# Patient Record
Sex: Female | Born: 1970 | Hispanic: No | Marital: Married | Smoking: Never smoker
Health system: Southern US, Community
[De-identification: ages and names within clinical notes are randomized; demographics above are authoritative.]

## PROBLEM LIST (undated history)

## (undated) DIAGNOSIS — M810 Age-related osteoporosis without current pathological fracture: Secondary | ICD-10-CM

## (undated) DIAGNOSIS — E288 Other ovarian dysfunction: Secondary | ICD-10-CM

## (undated) DIAGNOSIS — G4700 Insomnia, unspecified: Secondary | ICD-10-CM

## (undated) DIAGNOSIS — M8580 Other specified disorders of bone density and structure, unspecified site: Secondary | ICD-10-CM

## (undated) DIAGNOSIS — E7800 Pure hypercholesterolemia, unspecified: Secondary | ICD-10-CM

## (undated) DIAGNOSIS — E2839 Other primary ovarian failure: Secondary | ICD-10-CM

## (undated) DIAGNOSIS — T7840XA Allergy, unspecified, initial encounter: Secondary | ICD-10-CM

## (undated) DIAGNOSIS — E221 Hyperprolactinemia: Secondary | ICD-10-CM

## (undated) HISTORY — DX: Other ovarian dysfunction: E28.8

## (undated) HISTORY — PX: TUBAL LIGATION: SHX77

## (undated) HISTORY — DX: Age-related osteoporosis without current pathological fracture: M81.0

## (undated) HISTORY — DX: Other primary ovarian failure: E28.39

## (undated) HISTORY — DX: Pure hypercholesterolemia, unspecified: E78.00

## (undated) HISTORY — DX: Allergy, unspecified, initial encounter: T78.40XA

## (undated) HISTORY — DX: Insomnia, unspecified: G47.00

## (undated) HISTORY — DX: Hyperprolactinemia: E22.1

## (undated) HISTORY — PX: CHOLECYSTECTOMY: SHX55

## (undated) HISTORY — DX: Other specified disorders of bone density and structure, unspecified site: M85.80

---

## 2003-03-05 ENCOUNTER — Ambulatory Visit (HOSPITAL_COMMUNITY): Admission: RE | Admit: 2003-03-05 | Discharge: 2003-03-05 | Payer: Self-pay | Admitting: *Deleted

## 2003-03-05 ENCOUNTER — Encounter: Payer: Self-pay | Admitting: *Deleted

## 2003-05-15 ENCOUNTER — Ambulatory Visit (HOSPITAL_COMMUNITY): Admission: RE | Admit: 2003-05-15 | Discharge: 2003-05-15 | Payer: Self-pay | Admitting: *Deleted

## 2003-05-22 ENCOUNTER — Encounter: Admission: RE | Admit: 2003-05-22 | Discharge: 2003-08-20 | Payer: Self-pay | Admitting: *Deleted

## 2003-05-30 ENCOUNTER — Encounter: Admission: RE | Admit: 2003-05-30 | Discharge: 2003-05-30 | Payer: Self-pay | Admitting: *Deleted

## 2003-06-06 ENCOUNTER — Encounter: Admission: RE | Admit: 2003-06-06 | Discharge: 2003-06-06 | Payer: Self-pay | Admitting: *Deleted

## 2003-06-13 ENCOUNTER — Encounter: Admission: RE | Admit: 2003-06-13 | Discharge: 2003-06-13 | Payer: Self-pay | Admitting: *Deleted

## 2003-06-20 ENCOUNTER — Encounter: Admission: RE | Admit: 2003-06-20 | Discharge: 2003-06-20 | Payer: Self-pay | Admitting: *Deleted

## 2003-06-27 ENCOUNTER — Ambulatory Visit (HOSPITAL_COMMUNITY): Admission: RE | Admit: 2003-06-27 | Discharge: 2003-06-27 | Payer: Self-pay | Admitting: *Deleted

## 2003-06-27 ENCOUNTER — Encounter: Admission: RE | Admit: 2003-06-27 | Discharge: 2003-06-27 | Payer: Self-pay | Admitting: *Deleted

## 2003-06-29 ENCOUNTER — Encounter: Admission: RE | Admit: 2003-06-29 | Discharge: 2003-06-29 | Payer: Self-pay | Admitting: Family Medicine

## 2003-07-04 ENCOUNTER — Encounter: Admission: RE | Admit: 2003-07-04 | Discharge: 2003-07-04 | Payer: Self-pay | Admitting: *Deleted

## 2003-07-06 ENCOUNTER — Encounter: Admission: RE | Admit: 2003-07-06 | Discharge: 2003-07-06 | Payer: Self-pay | Admitting: *Deleted

## 2003-07-11 ENCOUNTER — Encounter: Admission: RE | Admit: 2003-07-11 | Discharge: 2003-07-11 | Payer: Self-pay | Admitting: *Deleted

## 2003-07-13 ENCOUNTER — Encounter: Admission: RE | Admit: 2003-07-13 | Discharge: 2003-07-13 | Payer: Self-pay | Admitting: *Deleted

## 2003-07-18 ENCOUNTER — Encounter: Admission: RE | Admit: 2003-07-18 | Discharge: 2003-07-18 | Payer: Self-pay | Admitting: *Deleted

## 2003-07-20 ENCOUNTER — Encounter: Admission: RE | Admit: 2003-07-20 | Discharge: 2003-07-20 | Payer: Self-pay | Admitting: *Deleted

## 2003-07-23 ENCOUNTER — Encounter: Payer: Self-pay | Admitting: *Deleted

## 2003-07-23 ENCOUNTER — Inpatient Hospital Stay (HOSPITAL_COMMUNITY): Admission: RE | Admit: 2003-07-23 | Discharge: 2003-07-23 | Payer: Self-pay | Admitting: *Deleted

## 2003-07-23 ENCOUNTER — Inpatient Hospital Stay (HOSPITAL_COMMUNITY): Admission: AD | Admit: 2003-07-23 | Discharge: 2003-07-27 | Payer: Self-pay | Admitting: Obstetrics and Gynecology

## 2003-07-25 ENCOUNTER — Encounter (INDEPENDENT_AMBULATORY_CARE_PROVIDER_SITE_OTHER): Payer: Self-pay

## 2004-07-01 ENCOUNTER — Emergency Department (HOSPITAL_COMMUNITY): Admission: EM | Admit: 2004-07-01 | Discharge: 2004-07-01 | Payer: Self-pay | Admitting: Emergency Medicine

## 2007-03-21 ENCOUNTER — Emergency Department (HOSPITAL_COMMUNITY): Admission: EM | Admit: 2007-03-21 | Discharge: 2007-03-21 | Payer: Self-pay | Admitting: Emergency Medicine

## 2008-01-20 ENCOUNTER — Other Ambulatory Visit: Admission: RE | Admit: 2008-01-20 | Discharge: 2008-01-20 | Payer: Self-pay | Admitting: Gynecology

## 2008-02-06 ENCOUNTER — Encounter: Admission: RE | Admit: 2008-02-06 | Discharge: 2008-02-06 | Payer: Self-pay | Admitting: Gynecology

## 2009-02-05 ENCOUNTER — Ambulatory Visit: Payer: Self-pay | Admitting: Gynecology

## 2009-02-05 ENCOUNTER — Encounter: Payer: Self-pay | Admitting: Gynecology

## 2009-02-05 ENCOUNTER — Other Ambulatory Visit: Admission: RE | Admit: 2009-02-05 | Discharge: 2009-02-05 | Payer: Self-pay | Admitting: Gynecology

## 2009-02-11 ENCOUNTER — Ambulatory Visit: Payer: Self-pay | Admitting: Gynecology

## 2009-05-20 ENCOUNTER — Ambulatory Visit: Payer: Self-pay | Admitting: Gynecology

## 2010-04-17 ENCOUNTER — Other Ambulatory Visit: Admission: RE | Admit: 2010-04-17 | Discharge: 2010-04-17 | Payer: Self-pay | Admitting: Gynecology

## 2010-04-17 ENCOUNTER — Ambulatory Visit: Payer: Self-pay | Admitting: Gynecology

## 2010-07-22 ENCOUNTER — Ambulatory Visit: Payer: Self-pay | Admitting: Gynecology

## 2010-11-18 ENCOUNTER — Encounter
Admission: RE | Admit: 2010-11-18 | Discharge: 2010-11-18 | Payer: Self-pay | Source: Home / Self Care | Attending: Gynecology | Admitting: Gynecology

## 2011-04-10 NOTE — Op Note (Signed)
Karen Peterson, Karen Peterson                          ACCOUNT NO.:  [id number]   MEDICAL RECORD NO.:  [id number]                   PATIENT TYPE:  INP   LOCATION:  9322                                 FACILITY:  [facility]   PHYSICIAN:  Phil D. Okey Dupre, M.D.                  DATE OF BIRTH:  02-14-1971   DATE OF PROCEDURE:  07/25/2003  DATE OF DISCHARGE:                                 OPERATIVE REPORT   PROCEDURE:  Modified Pomeroy bilateral tubal ligation.   PREOPERATIVE DIAGNOSIS:  Multiparity,voluntary sterilization.   POSTOPERATIVE DIAGNOSIS:  Multiparity,voluntary sterilization.   ANESTHESIA:  Epidural.   SURGEON:  Phil D. Rose, M.D.   ESTIMATED BLOOD LOSS:  Minimal, less than 5 mL.   PROCEDURE IN DETAIL:  Under satisfactory epidural anesthesia, with the  patient in the dorsal supine position, the abdomen was prepped and draped in  the usual sterile manner and entered through a subumbilical transverse  incision situated 3 cm below the umbilicus and extended for a total length  of 5 cm.  The abdomen was entered by layers and on entering the peritoneal  cavity, the right fallopian tube was grasped in the mid portion, followed  down to the fimbria.  Then, a hemostat was placed through the mesosalpinx  beneath the tube in an avascular area and a #1 plain suture catgut brought  through this, tied around the distal and proximal end of the tube, forming a  loop of approximately 2 cm above the tie.  The second tie was placed just  below the first tie.  The section of tube above the ties was excised and  sent for pathological diagnosis.  The exposed ends of the tubes from the  dissection were coagulated with hot cautery.  The area was observed for  bleeding, none was noted.  The same procedure was carried out with regards  to the left fallopian tube.  The peritoneum was then closed with a  continuous running 0 Vicryl on an atraumatic needle which was tented up to  close the fascia with a running  suture.  This was tied and cut short.  Subcuticular sutures of 3-0 Monocryl was used for subcuticular closure.  A  dry, sterile dressing was applied.  Minimal blood loss during the procedure  was less than 5 mL.  The patient tolerated the procedure well.  Tape,  instrument, sponge, and needle counts were correct at the end of the  procedure.  The patient was transferred to the recovery room in satisfactory  condition.  The portion of fallopian tube was removed and sent for  pathological diagnosis.                                               Phil D. Okey Dupre, M.D.  PDR/MEDQ  D:  07/25/2003  T:  07/25/2003  Job:  [id number]

## 2011-04-10 NOTE — Discharge Summary (Signed)
   NAMEZOEI, AMISON                          ACCOUNT NO.:  [id number]   MEDICAL RECORD NO.:  [id number]                   PATIENT TYPE:  INP   LOCATION:  9322                                 [facility]ACILITY:  [facility]   PHYSICIAN:  Phil D. Okey Dupre, M.D.                  DATE OF BIRTH:  10/25/1971   DATE OF ADMISSION:  07/23/2003  DATE OF DISCHARGE:  07/27/2003                                 DISCHARGE SUMMARY   HISTORY:  This is a 40 year old G4, P3-0-0-3 at the time of admission at 90-  weeks estimated gestational age by last menstrual period who presented to  [facility] for induction of labor, on July 23, 2003.  The patient had  been seen in the MAU by Dr. Gavin Potters and was sent for induction secondary to  questionable macrosomic fetus.  The patient experienced gestational diabetes  and had a history of pregnancy induced hypertension for her last pregnancy.   [facility]  She was  taking insulin intrapartum.  Has no known allergies.  GBS negative.  Rubella  immune.  Rh negative.   The patient admitted for her cervical ripening.  Given Cervidil and, on  July 24, 2003 at approximately 0630, delivered by normal spontaneous  vaginal delivery.  The patient had subsequent bilateral tubal ligation.  Please see op-note for details of this procedure.  The patient had an  unremarkable postpartum and postoperative course.  Blood sugars returned to  normal.  Hemoglobin, prior to discharge, was 10.9.   Patient given instructions to visit [facility]U if she has an increase in abdominal  pain, tenderness or has redness or discharge from her op site.   DISCHARGE MEDICATIONS:  1. She was given ibuprofen 600 mg, one by mouth every six hours as needed     for pain.  2. Percocet 5/325, one tab every six hours as needed for pain.  3. Prenatal vitamins, one tab every day x 6 weeks.   DIET:  The patient was advised that her diet could be regular.   ACTIVITY:  No restriction.   SEXUAL ACTIVITY:  Nothing in the vagina for six weeks.   FOLLOWUP:  The patient is scheduled to followup, currently, at [facility]  in six weeks, although, may consider followup earlier to reassess diabetic  status.  The patient was given an appointment for her baby at [facility] on August 01, 2003 at 10:30 with Dr. Farris Has.  The patient was  discharged to home in stable condition.     Franklyn Lor, MD                            Javier Glazier. Okey Dupre, M.D.    TD/MEDQ  D:  07/27/2003  T:  07/28/2003  Job:  [id number]

## 2012-01-24 ENCOUNTER — Ambulatory Visit (INDEPENDENT_AMBULATORY_CARE_PROVIDER_SITE_OTHER): Payer: BC Managed Care – PPO | Admitting: Family Medicine

## 2012-01-24 ENCOUNTER — Ambulatory Visit: Payer: BC Managed Care – PPO

## 2012-01-24 VITALS — BP 133/88 | HR 87 | Temp 98.3°F | Resp 16 | Ht 62.5 in | Wt 188.0 lb

## 2012-01-24 DIAGNOSIS — M719 Bursopathy, unspecified: Secondary | ICD-10-CM

## 2012-01-24 DIAGNOSIS — M25519 Pain in unspecified shoulder: Secondary | ICD-10-CM

## 2012-01-24 DIAGNOSIS — M67919 Unspecified disorder of synovium and tendon, unspecified shoulder: Secondary | ICD-10-CM

## 2012-01-24 DIAGNOSIS — IMO0001 Reserved for inherently not codable concepts without codable children: Secondary | ICD-10-CM

## 2012-01-24 DIAGNOSIS — M7580 Other shoulder lesions, unspecified shoulder: Secondary | ICD-10-CM

## 2012-01-24 DIAGNOSIS — M25511 Pain in right shoulder: Secondary | ICD-10-CM

## 2012-01-24 MED ORDER — MELOXICAM 7.5 MG PO TABS: 7.5000 mg | ORAL_TABLET | Freq: Every day | ORAL | Status: AC

## 2012-01-24 NOTE — Patient Instructions (Addendum)
Can take mobic each day as needed for pain and inflammation.  Heat over area and try to work on range of motion, only use sling as needed as the stiffness in your shoulder can worsen with this.  Recheck in next 4-5 days if not improving. Return to the clinic or go to the nearest emergency room if any of your symptoms worsen or new symptoms occur.    Rotator Cuff Tendonitis  The rotator cuff is the collection of all the muscles and tendons (the supraspinatus, infraspinatus, subscapularis, and teres minor muscles and their tendons) that help your shoulder stay in place. This unit holds the head of the upper arm bone (humerus) in the cup (fossa) of the shoulder blade (scapula). Basically, it connects the arm to the shoulder. Tendinitis is a swelling and irritation of the tissue, called cord like structures (tendons) that connect muscle to bone. It usually is caused by overusing the joint involved. When the tissue surrounding a tendon (the synovium) becomes inflamed, it is called tenosynovitis. This also is often the result of overuse in people whose jobs require repetitive (over and over again) types of motion. HOME CARE INSTRUCTIONS   Use a sling or splint for as long as directed by your caregiver until the pain decreases.   Apply ice to the injury for 15 to 20 minutes, 3 to 4 times per day. Put the ice in a plastic bag and place a towel between the bag of ice and your skin.   Try to avoid use other than gentle range of motion while your shoulder is painful. Use and exercise only as directed by your caregiver. Stop exercises or range of motion if pain or discomfort increases, unless directed otherwise by your caregiver.   Only take over-the-counter or prescription medicines for pain, discomfort, or fever as directed by your caregiver.   If you were give a shoulder sling and straps (immobilizer), do not remove it except as directed, or until you see a caregiver for a follow-up examination. If you need  to remove it, move your arm as little as possible or as directed.   You may want to sleep on several pillows at night to lessen swelling and pain.  SEEK IMMEDIATE MEDICAL CARE IF:   Pain in your shoulder increases or new pain develops in your arm, hand, or fingers and is not relieved with medications.   You develop new, unexplained symptoms, especially increased numbness in the hands or loss of strength, or you develop any worsening of the problems which brought you in for care.   Your arm, hand, or fingers are numb or tingling.   Your arm, hand, or fingers are swollen, painful, or turn white or blue.  Document Released: 01/30/2004 Document Revised: 10/29/2011 Document Reviewed: 09/06/2008 [employer] [facility] Patient Information 2012 [place], [place].

## 2012-01-24 NOTE — Progress Notes (Signed)
  Subjective:    Patient ID: Karen Peterson, female    DOB: 09-18-1971, 41 y.o.   MRN: [id number]  HPI Karen Peterson is a 41 y.o. female History of Diabetes and high cholesterol.  Unknown last A1c - followed by Dr. Talmage Nap.   C/o 1 week history of R shoulder pain, NKI.  Sore to move.  Noticed some soreness in upper shoulder to neck today only.  No prior similar symptoms or surgery.   R hand dominant. 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   Tx:  None except occasional tylenol.    Review of Systems  Constitutional: Negative for fever, chills and activity change.  HENT: Negative for neck pain and neck stiffness.   Respiratory: Negative for chest tightness and shortness of breath.   Cardiovascular: Negative for chest pain.  Musculoskeletal: Positive for arthralgias.  Neurological: Negative for weakness and numbness.       Objective:   Physical Exam  Constitutional: She appears well-developed and well-nourished.  HENT:  Head: Normocephalic and atraumatic.  Eyes: Conjunctivae are normal. Pupils are equal, round, and reactive to light.  Neck: Muscular tenderness present. Decreased range of motion present.       Slight R paraspinal spasm/ttp and decreased L rotation due to "tightness", not pain.  Cardiovascular: Normal rate, regular rhythm and normal heart sounds.   No murmur heard. Pulmonary/Chest: Effort normal and breath sounds normal.  Musculoskeletal:       Right shoulder: She exhibits decreased range of motion. She exhibits no tenderness and no bony tenderness.       Right elbow: Normal.      Right wrist: Normal.       R shoulder AROM: flex 100, abd 100, IR equal - L1. PROM : flex~110, abd ~110, but guarded (pulling sensation). [place]/AC nt Guarded RC testing, but appears to have strength with empty can and RTC testing. Positive Neer and Hawkins.  Neurological: She has normal strength. No sensory  deficit.    [facility] reading (PRIMARY) by  Dr. Neva Seat R shoulder: NAD    Assessment & Plan:  Karen Peterson is a 41 y.o. female Hx of DM.  R shoulder pain RTC tendonitis, subacromial bursitis likely with repetitive movements, versus adhesive capsulitis.  Guarded exam at present.  Trial of mobic 7.5mg  qd - short course w/ hx. DM.  Heat, ROM, oow x 3 days, then recheck - may need injection vs. Ortho/pt eval. Sooner if worse.

## 2012-02-23 ENCOUNTER — Other Ambulatory Visit: Payer: Self-pay | Admitting: Gynecology

## 2012-02-23 DIAGNOSIS — Z1231 Encounter for screening mammogram for malignant neoplasm of breast: Secondary | ICD-10-CM

## 2012-02-29 DIAGNOSIS — G4700 Insomnia, unspecified: Secondary | ICD-10-CM | POA: Insufficient documentation

## 2012-02-29 DIAGNOSIS — E221 Hyperprolactinemia: Secondary | ICD-10-CM | POA: Insufficient documentation

## 2012-03-07 ENCOUNTER — Ambulatory Visit
Admission: RE | Admit: 2012-03-07 | Discharge: 2012-03-07 | Disposition: A | Discharge status: Home / Self Care | Payer: BC Managed Care – PPO | Source: Ambulatory Visit | Attending: Gynecology | Admitting: Gynecology

## 2012-03-07 DIAGNOSIS — Z1231 Encounter for screening mammogram for malignant neoplasm of breast: Secondary | ICD-10-CM

## 2012-03-10 ENCOUNTER — Encounter: Payer: Self-pay | Admitting: Gynecology

## 2012-03-16 ENCOUNTER — Encounter: Payer: Self-pay | Admitting: Gynecology

## 2012-03-24 ENCOUNTER — Encounter: Payer: Self-pay | Admitting: Gynecology

## 2012-03-24 ENCOUNTER — Ambulatory Visit (INDEPENDENT_AMBULATORY_CARE_PROVIDER_SITE_OTHER): Payer: BC Managed Care – PPO | Admitting: Gynecology

## 2012-03-24 VITALS — BP 128/84 | Ht 62.0 in | Wt 190.0 lb

## 2012-03-24 DIAGNOSIS — E119 Type 2 diabetes mellitus without complications: Secondary | ICD-10-CM | POA: Insufficient documentation

## 2012-03-24 DIAGNOSIS — E28319 Asymptomatic premature menopause: Secondary | ICD-10-CM | POA: Insufficient documentation

## 2012-03-24 DIAGNOSIS — Z01419 Encounter for gynecological examination (general) (routine) without abnormal findings: Secondary | ICD-10-CM

## 2012-03-24 DIAGNOSIS — J019 Acute sinusitis, unspecified: Secondary | ICD-10-CM

## 2012-03-24 DIAGNOSIS — R635 Abnormal weight gain: Secondary | ICD-10-CM

## 2012-03-24 DIAGNOSIS — E7800 Pure hypercholesterolemia, unspecified: Secondary | ICD-10-CM | POA: Insufficient documentation

## 2012-03-24 MED ORDER — NORETHINDRONE ACET-ETHINYL EST 1.5-30 MG-MCG PO TABS: 1.0000 | ORAL_TABLET | Freq: Every day | ORAL | Status: DC

## 2012-03-24 MED ORDER — CEFUROXIME AXETIL 500 MG PO TABS: 500.0000 mg | ORAL_TABLET | Freq: Two times a day (BID) | ORAL | Status: AC

## 2012-03-24 NOTE — Progress Notes (Signed)
Karen Peterson 09/16/1971 [id number]   History:    41 y.o.  for annual exam  with four-day history of frontal sinus pressure and postnasal drip. Patient denies fever chills nausea or vomiting. Patient has been followed by Dr. Lurene Shadow for her hypercholesterolemia and type 2 diabetes all her labs were done in February this year so no additional blood work will be drawn today. Patient been diagnosed with premature ovarian failure in 2008 and she has been on Junel 1/20 oral contraceptive pill. She is taking her calcium and vitamin D for osteoporosis prevention. She did have a bone density study in 2010 with her lowest T score of -2.0 at the [facility]. Patient several years ago with hyperprolactinemia and her last prolactin level several years ago was normal when off medication. Patient denies any nipple discharge or any unusual headaches or visual disturbance. Last Pap smear 2011 normal.   Past medical history,surgical history, family history and social history were all reviewed and documented in the EPIC chart.  Gynecologic History Patient's last menstrual period was 03/15/2012. Contraception: OCP (estrogen/progesterone) Last Pap:  2011. Results were: normal Last mammogram: 2013. Results were: normal  Obstetric History OB History    Grav Para Term Preterm Abortions TAB SAB Ect Mult Living   4 4 4       4      # Outc Date GA Lbr Len/2nd Wgt Sex Del Anes PTL Lv   1 TRM     F SVD  No Yes   2 TRM     M SVD  No Yes   3 TRM     F SVD  No Yes   4 TRM     F SVD  No Yes       ROS:  Was performed and pertinent positives and negatives are included in the history.  Exam: chaperone present  BP 128/84  Ht 5\' 2"  (1.575 m)  Wt 190 lb (86.183 kg)  BMI 34.75 kg/m2  LMP 03/15/2012  Body mass index is 34.75 kg/(m^2).  General appearance : Well developed well nourished female. No acute distress HEENT: Neck supple, trachea midline, no carotid bruits, no thyroidmegaly, tender frontal and maxillary sinuses   Lungs: Clear to auscultation, no rhonchi or wheezes, or rib retractions  Heart: Regular rate and rhythm, no murmurs or gallops Breast:Examined in sitting and supine position were symmetrical in appearance, no palpable masses or tenderness,  no skin retraction, no nipple inversion, no nipple discharge, no skin discoloration, no axillary or supraclavicular lymphadenopathy Abdomen: no palpable masses or tenderness, no rebound or guarding Extremities: no edema or skin discoloration or tenderness  Pelvic:  Bartholin, Urethra, Skene Glands: Within normal limits             Vagina: No gross lesions or discharge  Cervix: No gross lesions or discharge  Uterus   anteverted, normal size, shape and consistency, non-tender and mobile  Adnexa  Without masses or tenderness  Anus and perineum  normal   Rectovaginal   not done             Hemoccult not done      Assessment/Plan:  41 y.o. female for annual exam with clinical evidence of acute sinusitis. Patient will be placed on Ceftin 500 mg one by mouth twice a day for 7 days. She can buy over-the-counter Mucinex to take when necessary. New Pap smear screening guidelines discussed. She will not need a Pap smear until next year. She was encouraged to do her  monthly self breast examination. Literature information was provided on the following: Cholesterol-lowering diet, exercise, and sinusitis. She will schedule her bone density study next few weeks. Will followup in one year or when necessary.     Ok Edwards MD, 11:13 AM 03/24/2012

## 2012-03-24 NOTE — Patient Instructions (Addendum)
Cholesterol Control Diet  Cholesterol levels in your body are determined significantly by your diet. Cholesterol levels may also be related to heart disease. The following material helps to explain this relationship and discusses what you can do to help keep your heart healthy. Not all cholesterol is bad. Low-density lipoprotein (LDL) cholesterol is the "bad" cholesterol. It may cause fatty deposits to build up inside your arteries. High-density lipoprotein (HDL) cholesterol is "good." It helps to remove the "bad" LDL cholesterol from your blood. Cholesterol is a very important risk factor for heart disease. Other risk factors are high blood pressure, smoking, stress, heredity, and weight. The heart muscle gets its supply of blood through the coronary arteries. If your LDL cholesterol is high and your HDL cholesterol is low, you are at risk for having fatty deposits build up in your coronary arteries. This leaves less room through which blood can flow. Without sufficient blood and oxygen, the heart muscle cannot function properly and you may feel chest pains (angina pectoris). When a coronary artery closes up entirely, a part of the heart muscle may die, causing a heart attack (myocardial infarction). CHECKING CHOLESTEROL When your caregiver sends your blood to a lab to be analyzed for cholesterol, a complete lipid (fat) profile may be done. With this test, the total amount of cholesterol and levels of LDL and HDL are determined. Triglycerides are a type of fat that circulates in the blood and can also be used to determine heart disease risk. The list below describes what the numbers should be: Test: Total Cholesterol.  Less than 200 mg/dl.  Test: LDL "bad cholesterol."  Less than 100 mg/dl.   Less than 70 mg/dl if you are at very high risk of a heart attack or sudden cardiac death.  Test: HDL "good cholesterol."  Greater than 50 mg/dl for women.    Greater than 40 mg/dl for men.  Test: Triglycerides.  Less than 150 mg/dl.  CONTROLLING CHOLESTEROL WITH DIET Although exercise and lifestyle factors are important, your diet is key. That is because certain foods are known to raise cholesterol and others to lower it. The goal is to balance foods for their effect on cholesterol and more importantly, to replace saturated and trans fat with other types of fat, such as monounsaturated fat, polyunsaturated fat, and omega-3 fatty acids. On average, a person should consume no more than 15 to 17 g of saturated fat daily. Saturated and trans fats are considered "bad" fats, and they will raise LDL cholesterol. Saturated fats are primarily found in animal products such as meats, butter, and cream. However, that does not mean you need to sacrifice all your favorite foods. Today, there are good tasting, low-fat, low-cholesterol substitutes for most of the things you like to eat. Choose low-fat or nonfat alternatives. Choose round or loin cuts of red meat, since these types of cuts are lowest in fat and cholesterol. Chicken (without the skin), fish, veal, and ground [place] breast are excellent choices. Eliminate fatty meats, such as hot dogs and salami. Even shellfish have little or no saturated fat. Have a 3 oz (85 g) portion when you eat lean meat, poultry, or fish. Trans fats are also called "partially hydrogenated oils." They are oils that have been scientifically manipulated so that they are solid at room temperature resulting in a longer shelf life and improved taste and texture of foods in which they are added. Trans fats are found in stick margarine, some tub margarines, cookies, crackers, and baked goods.  When baking and cooking, oils are an excellent substitute for butter. The monounsaturated oils are especially beneficial since it is believed they lower LDL and raise HDL. The oils you should avoid entirely are saturated tropical oils, such as coconut and  palm.  Remember to eat liberally from food groups that are naturally free of saturated and trans fat, including fish, fruit, vegetables, beans, grains (barley, rice, couscous, bulgur wheat), and pasta (without cream sauces).  IDENTIFYING FOODS THAT LOWER CHOLESTEROL  Soluble fiber may lower your cholesterol. This type of fiber is found in fruits such as apples, vegetables such as broccoli, potatoes, and carrots, legumes such as beans, peas, and lentils, and grains such as barley. Foods fortified with plant sterols (phytosterol) may also lower cholesterol. You should eat at least 2 g per day of these foods for a cholesterol lowering effect.  Read package labels to identify low-saturated fats, trans fats free, and low-fat foods at the supermarket. Select cheeses that have only 2 to 3 g saturated fat per ounce. Use a heart-healthy tub margarine that is free of trans fats or partially hydrogenated oil. When buying baked goods (cookies, crackers), avoid partially hydrogenated oils. Breads and muffins should be made from whole grains (whole-wheat or whole oat flour, instead of "flour" or "enriched flour"). Buy non-creamy canned soups with reduced salt and no added fats.  FOOD PREPARATION TECHNIQUES  Never deep-fry. If you must fry, either stir-fry, which uses very little fat, or use non-stick cooking sprays. When possible, broil, bake, or roast meats, and steam vegetables. Instead of dressing vegetables with butter or margarine, use lemon and herbs, applesauce and cinnamon (for squash and sweet potatoes), nonfat yogurt, salsa, and low-fat dressings for salads.  LOW-SATURATED FAT / LOW-FAT FOOD SUBSTITUTES Meats / Saturated Fat (g)  Avoid: Steak, marbled (3 oz/85 g) / 11 g   Choose: Steak, lean (3 oz/85 g) / 4 g   Avoid: Hamburger (3 oz/85 g) / 7 g   Choose: Hamburger, lean (3 oz/85 g) / 5 g   Avoid: Ham (3 oz/85 g) / 6 g   Choose: Ham, lean cut (3 oz/85 g) / 2.4 g   Avoid: Chicken, with skin, dark  meat (3 oz/85 g) / 4 g   Choose: Chicken, skin removed, dark meat (3 oz/85 g) / 2 g   Avoid: Chicken, with skin, light meat (3 oz/85 g) / 2.5 g   Choose: Chicken, skin removed, light meat (3 oz/85 g) / 1 g  Dairy / Saturated Fat (g)  Avoid: Whole milk (1 cup) / 5 g   Choose: Low-fat milk, 2% (1 cup) / 3 g   Choose: Low-fat milk, 1% (1 cup) / 1.5 g   Choose: Skim milk (1 cup) / 0.3 g   Avoid: Hard cheese (1 oz/28 g) / 6 g   Choose: Skim milk cheese (1 oz/28 g) / 2 to 3 g   Avoid: Cottage cheese, 4% fat (1 cup) / 6.5 g   Choose: Low-fat cottage cheese, 1% fat (1 cup) / 1.5 g   Avoid: Ice cream (1 cup) / 9 g   Choose: Sherbet (1 cup) / 2.5 g   Choose: Nonfat frozen yogurt (1 cup) / 0.3 g   Choose: Frozen fruit bar / trace   Avoid: Whipped cream (1 tbs) / 3.5 g   Choose: Nondairy whipped topping (1 tbs) / 1 g  Condiments / Saturated Fat (g)  Avoid: Mayonnaise (1 tbs) / 2 g   Choose: Low-fat  mayonnaise (1 tbs) / 1 g   Avoid: Butter (1 tbs) / 7 g   Choose: Extra light margarine (1 tbs) / 1 g   Avoid: Coconut oil (1 tbs) / 11.8 g   Choose: Olive oil (1 tbs) / 1.8 g   Choose: Corn oil (1 tbs) / 1.7 g   Choose: Safflower oil (1 tbs) / 1.2 g   Choose: Sunflower oil (1 tbs) / 1.4 g   Choose: Soybean oil (1 tbs) / 2.4 g   Choose: Canola oil (1 tbs) / 1 g  Document Released: 11/09/2005 Document Revised: 07/22/2011 Document Reviewed: 04/30/2011 [facility] Patient Information 2012 [place], [place].  Exercise to Lose Weight Exercise and a healthy diet may help you lose weight. Your doctor may suggest specific exercises. EXERCISE IDEAS AND TIPS  Choose low-cost things you enjoy doing, such as walking, bicycling, or exercising to workout videos.   Take stairs instead of the elevator.   Walk during your lunch break.   Park your car further away from work or school.   Go to a gym or an exercise class.   Start with 5 to 10 minutes of exercise each day. Build up to  30 minutes of exercise 4 to 6 days a week.   Wear shoes with good support and comfortable clothes.   Stretch before and after working out.   Work out until you breathe harder and your heart beats faster.   Drink extra water when you exercise.   Do not do so much that you hurt yourself, feel dizzy, or get very short of breath.  Exercises that burn about 150 calories:  Running 1  miles in 15 minutes.   Playing volleyball for 45 to 60 minutes.   Washing and waxing a car for 45 to 60 minutes.   Playing touch football for 45 minutes.   Walking 1  miles in 35 minutes.   Pushing a stroller 1  miles in 30 minutes.   Playing basketball for 30 minutes.   Raking leaves for 30 minutes.   Bicycling 5 miles in 30 minutes.   Walking 2 miles in 30 minutes.   Dancing for 30 minutes.   Shoveling snow for 15 minutes.   Swimming laps for 20 minutes.   Walking up stairs for 15 minutes.   Bicycling 4 miles in 15 minutes.   Gardening for 30 to 45 minutes.   Jumping rope for 15 minutes.   Washing windows or floors for 45 to 60 minutes.  Document Released: 12/12/2010 Document Revised: 07/22/2011 Document Reviewed: 12/12/2010 [facility] Patient Information 2012 [place], [place].   Sinusitis Sinuses are air pockets within the bones of your face. The growth of bacteria within a sinus leads to infection. The infection prevents the sinuses from draining. This infection is called sinusitis. SYMPTOMS  There will be different areas of pain depending on which sinuses have become infected.  The maxillary sinuses often produce pain beneath the eyes.   Frontal sinusitis may cause pain in the middle of the forehead and above the eyes.  Other problems (symptoms) include:  Toothaches.   Colored, pus-like (purulent) drainage from the nose.   Swelling, warmth, and tenderness over the sinus areas may be signs of infection.  TREATMENT  Sinusitis is most often determined by an exam.X-rays may  be taken. If x-rays have been taken, make sure you obtain your results or find out how you are to obtain them. Your caregiver may give you medications (antibiotics). These are medications that  will help kill the bacteria causing the infection. You may also be given a medication (decongestant) that helps to reduce sinus swelling.  HOME CARE INSTRUCTIONS   Only take over-the-counter or prescription medicines for pain, discomfort, or fever as directed by your caregiver.   Drink extra fluids. Fluids help thin the mucus so your sinuses can drain more easily.   Applying either moist heat or ice packs to the sinus areas may help relieve discomfort.   Use saline nasal sprays to help moisten your sinuses. The sprays can be found at your local drugstore.  SEEK IMMEDIATE MEDICAL CARE IF:  You have a fever.   You have increasing pain, severe headaches, or toothache.   You have nausea, vomiting, or drowsiness.   You develop unusual swelling around the face or trouble seeing.  MAKE SURE YOU:   Understand these instructions.   Will watch your condition.   Will get help right away if you are not doing well or get worse.  Document Released: 11/09/2005 Document Revised: 10/29/2011 Document Reviewed: 06/08/2007 [facility] Patient Information 2012 [place], [place].

## 2012-04-06 ENCOUNTER — Ambulatory Visit (INDEPENDENT_AMBULATORY_CARE_PROVIDER_SITE_OTHER): Payer: BC Managed Care – PPO

## 2012-04-06 DIAGNOSIS — M8580 Other specified disorders of bone density and structure, unspecified site: Secondary | ICD-10-CM

## 2012-04-06 DIAGNOSIS — M899 Disorder of bone, unspecified: Secondary | ICD-10-CM

## 2012-04-06 DIAGNOSIS — M949 Disorder of cartilage, unspecified: Secondary | ICD-10-CM

## 2012-04-06 DIAGNOSIS — E28319 Asymptomatic premature menopause: Secondary | ICD-10-CM

## 2012-04-15 ENCOUNTER — Encounter: Payer: Self-pay | Admitting: Gynecology

## 2012-09-23 ENCOUNTER — Ambulatory Visit (INDEPENDENT_AMBULATORY_CARE_PROVIDER_SITE_OTHER): Payer: BC Managed Care – PPO | Admitting: Emergency Medicine

## 2012-09-23 VITALS — BP 114/80 | HR 97 | Temp 98.3°F | Resp 18 | Ht 63.0 in | Wt 191.4 lb

## 2012-09-23 DIAGNOSIS — R42 Dizziness and giddiness: Secondary | ICD-10-CM

## 2012-09-23 DIAGNOSIS — J018 Other acute sinusitis: Secondary | ICD-10-CM

## 2012-09-23 MED ORDER — MECLIZINE HCL 50 MG PO TABS: 50.0000 mg | ORAL_TABLET | Freq: Three times a day (TID) | ORAL | Status: DC | PRN

## 2012-09-23 MED ORDER — PSEUDOEPHEDRINE-GUAIFENESIN ER 60-600 MG PO TB12: 1.0000 | ORAL_TABLET | Freq: Two times a day (BID) | ORAL | Status: DC

## 2012-09-23 MED ORDER — AMOXICILLIN-POT CLAVULANATE 875-125 MG PO TABS: 1.0000 | ORAL_TABLET | Freq: Two times a day (BID) | ORAL | Status: DC

## 2012-09-23 NOTE — Progress Notes (Signed)
[facility] [address], [place] [address] [phone]  Date:  09/23/2012   Name:  Karen Peterson   DOB:  1971-09-03   MRN:  [id number]  PCP:  No primary provider on file.    Chief Complaint: Headache, Sinusitis and Dizziness   History of Present Illness:  Karen Peterson is a 41 y.o. very pleasant female patient who presents with the following:  Ill with nasal congestion and purulent nasal drainage.  Has pain in frontal and maxillary area worse with cough or sneeze or bending over.  Ears feel pressure most on left.  Yesterday developed a headache and dizziness.   Dizziness is provoked by change in axis of head.  Denies fever or chills.  No nausea or vomiting.  No numbness, tingling or weakness.  Patient Active Problem List  Diagnosis  . Insomnia  . Type II diabetes mellitus  . Hypercholesterolemia  . Premature menopause    Past Medical History  Diagnosis Date  . Hyperprolactinemia   . Diabetes mellitus     TYPE II  . Insomnia   . Vitamin D deficiency   . High cholesterol   . Premature ovarian failure     Past Surgical History  Procedure Date  . Tubal ligation     PPTL  . Cholecystectomy     History  Substance Use Topics  . Smoking status: Never Smoker   . Smokeless tobacco: Never Used  . Alcohol Use: No    Family History  Problem Relation Age of Onset  . Hypertension Father   . Diabetes Father   . Heart disease Father   . Cancer Father     STOMACH     No Known Allergies  Medication list has been reviewed and updated.  Current Outpatient Prescriptions on File Prior to Visit  Medication Sig Dispense Refill  . bromocriptine (PARLODEL) 2.5 MG tablet Take 2.5 mg by mouth 2 (two) times a week. Take 1/2 on Monday and Friday      . meloxicam (MOBIC) 7.5 MG tablet Take 1 tablet (7.5 mg total) by mouth daily.  20 tablet  0  . Norethindrone Acetate-Ethinyl Estradiol (JUNEL,LOESTRIN,MICROGESTIN) 1.5-30 MG-MCG tablet Take 1 tablet by  mouth daily.  1 Package  11  . Saxagliptin-Metformin 03-999 MG TB24 Take by mouth 2 (two) times daily.       . simvastatin (ZOCOR) 40 MG tablet Take 40 mg by mouth every evening.      . zolpidem (AMBIEN) 10 MG tablet Take 10 mg by mouth at bedtime as needed.        Review of Systems:  As per HPI, otherwise negative.    Physical Examination: Filed Vitals:   09/23/12 1300  BP: 114/80  Pulse: 97  Temp: 98.3 F (36.8 C)  Resp: 18   Filed Vitals:   09/23/12 1300  Height: 5\' 3"  (1.6 m)  Weight: 191 lb 6.4 oz (86.818 kg)   Body mass index is 33.90 kg/(m^2). Ideal Body Weight: Weight in (lb) to have BMI = 25: 140.8    GEN: WDWN, NAD, Non-toxic, A & O x 3 HEENT: Atraumatic, Normocephalic. Neck supple. No masses, No LAD.  Tender maxillary sinuses Ears and Nose: No external deformity. CV: RRR, No M/G/R. No JVD. No thrill. No extra heart sounds. PULM: CTA B, no wheezes, crackles, rhonchi. No retractions. No resp. distress. No accessory muscle use. ABD: S, NT, ND, +BS. No rebound. No HSM. EXTR: No c/c/e NEURO Normal gait.  PSYCH: Normally interactive. Conversant. Not depressed or anxious appearing.  Calm demeanor.   Assessment and Plan: Sinusitis Vertigo augmentin mucinex d antivert Follow up as needed  Carmelina Dane, MD

## 2012-09-26 NOTE — Progress Notes (Signed)
Reviewed and agree.

## 2013-02-08 ENCOUNTER — Other Ambulatory Visit: Payer: Self-pay

## 2013-02-08 DIAGNOSIS — Z1231 Encounter for screening mammogram for malignant neoplasm of breast: Secondary | ICD-10-CM

## 2013-02-27 ENCOUNTER — Ambulatory Visit
Admission: RE | Admit: 2013-02-27 | Discharge: 2013-02-27 | Disposition: A | Discharge status: Home / Self Care | Payer: BC Managed Care – PPO | Source: Ambulatory Visit

## 2013-02-27 DIAGNOSIS — Z1231 Encounter for screening mammogram for malignant neoplasm of breast: Secondary | ICD-10-CM

## 2013-03-01 ENCOUNTER — Other Ambulatory Visit: Payer: Self-pay | Admitting: *Deleted

## 2013-03-01 DIAGNOSIS — R928 Other abnormal and inconclusive findings on diagnostic imaging of breast: Secondary | ICD-10-CM

## 2013-03-01 DIAGNOSIS — N6310 Unspecified lump in the right breast, unspecified quadrant: Secondary | ICD-10-CM

## 2013-03-14 ENCOUNTER — Ambulatory Visit
Admission: RE | Admit: 2013-03-14 | Discharge: 2013-03-14 | Disposition: A | Discharge status: Home / Self Care | Payer: BC Managed Care – PPO | Source: Ambulatory Visit | Attending: Gynecology | Admitting: Gynecology

## 2013-03-14 DIAGNOSIS — N6310 Unspecified lump in the right breast, unspecified quadrant: Secondary | ICD-10-CM

## 2013-03-14 DIAGNOSIS — R928 Other abnormal and inconclusive findings on diagnostic imaging of breast: Secondary | ICD-10-CM

## 2013-03-28 ENCOUNTER — Encounter: Payer: BC Managed Care – PPO | Admitting: Gynecology

## 2013-04-10 ENCOUNTER — Encounter: Payer: Self-pay | Admitting: Gynecology

## 2013-04-10 ENCOUNTER — Other Ambulatory Visit (HOSPITAL_COMMUNITY)
Admission: RE | Admit: 2013-04-10 | Discharge: 2013-04-10 | Disposition: A | Discharge status: Home / Self Care | Payer: BC Managed Care – PPO | Source: Ambulatory Visit | Attending: Gynecology | Admitting: Gynecology

## 2013-04-10 ENCOUNTER — Ambulatory Visit (INDEPENDENT_AMBULATORY_CARE_PROVIDER_SITE_OTHER): Payer: BC Managed Care – PPO | Admitting: Gynecology

## 2013-04-10 VITALS — BP 130/82 | Ht 62.0 in | Wt 190.0 lb

## 2013-04-10 DIAGNOSIS — Z23 Encounter for immunization: Secondary | ICD-10-CM

## 2013-04-10 DIAGNOSIS — Z01419 Encounter for gynecological examination (general) (routine) without abnormal findings: Secondary | ICD-10-CM | POA: Insufficient documentation

## 2013-04-10 DIAGNOSIS — F528 Other sexual dysfunction not due to a substance or known physiological condition: Secondary | ICD-10-CM

## 2013-04-10 DIAGNOSIS — E221 Hyperprolactinemia: Secondary | ICD-10-CM | POA: Insufficient documentation

## 2013-04-10 DIAGNOSIS — J329 Chronic sinusitis, unspecified: Secondary | ICD-10-CM

## 2013-04-10 DIAGNOSIS — Z1151 Encounter for screening for human papillomavirus (HPV): Secondary | ICD-10-CM | POA: Insufficient documentation

## 2013-04-10 DIAGNOSIS — I868 Varicose veins of other specified sites: Secondary | ICD-10-CM

## 2013-04-10 DIAGNOSIS — I8390 Asymptomatic varicose veins of unspecified lower extremity: Secondary | ICD-10-CM

## 2013-04-10 MED ORDER — CEFUROXIME AXETIL 500 MG PO TABS: 500.0000 mg | ORAL_TABLET | Freq: Two times a day (BID) | ORAL | Status: DC

## 2013-04-10 NOTE — Progress Notes (Signed)
Karen Peterson 03/12/1971 [id number]   History:    42 y.o.  for annual gyn exam With several complaints. Patient is complaining of sinus congestion. She's also complaining of decreased libido. Also she has mentioned that when she stands for long periods of time she has leg discomfort as a result of her varicose veins. Patient with history of premature ovarian failure diagnosed in 2005. Dr. Lurene Shadow has been monitoring her blood sugar as well as her prolactin level in treating her for hypercholesterolemia. All her lab work has been done by her endocrinologist. She is on the [facility] 1/20 and is having normal menstrual cycles. She is taking her calcium and vitamin D daily. She's taking Parlodel 2.5 mg half a tablet 2 times a week. Patient denies any prior history of abnormal Pap smears.  Past medical history,surgical history, family history and social history were all reviewed and documented in the EPIC chart.  Gynecologic History Patient's last menstrual period was 03/27/2013. Contraception: OCP (estrogen/progesterone) Last Pap: 2011. Results were: normal Last mammogram: 2014. Results were: normal  Obstetric History OB History   Grav Para Term Preterm Abortions TAB SAB Ect Mult Living   4 4 4       4      # Outc Date GA Lbr Len/2nd Wgt Sex Del Anes PTL Lv   1 TRM     F SVD  No Yes   2 TRM     M SVD  No Yes   3 TRM     F SVD  No Yes   4 TRM     F SVD  No Yes       ROS: A ROS was performed and pertinent positives and negatives are included in the history.  GENERAL: No fevers or chills. HEENTcomplaining of tender sinus and postnasal drip NECK: No pain or stiffness. CARDIOVASCULAR: No chest pain or pressure. No palpitations. PULMONARY: No shortness of breath, cough or wheeze. GASTROINTESTINAL: No abdominal pain, nausea, vomiting or diarrhea, melena or bright red blood per rectum. GENITOURINARY: No urinary frequency, urgency, hesitancy or dysuria. MUSCULOSKELETAL:painful lower legs when standing for  long periods of time due to her varicose veinsDERMATOLOGIC: No rash, no itching, no lesions. ENDOCRINE: No polyuria, polydipsia, no heat or cold intolerance. No recent change in weight. HEMATOLOGICAL: No anemia or easy bruising or bleeding. NEUROLOGIC: No headache, seizures, numbness, tingling or weakness. PSYCHIATRIC: No depression, no loss of interest in normal activity or change in sleep pattern.     Exam: chaperone present  BP 130/82  Ht 5\' 2"  (1.575 m)  Wt 190 lb (86.183 kg)  BMI 34.74 kg/m2  LMP 03/27/2013  Body mass index is 34.74 kg/(m^2).  General appearance : Well developed well nourished female. No acute distress HEENT: Neck supple, trachea midline, no carotid bruits, no thyroidmegaly tender frontal and maxillary sinuses Lungs: Clear to auscultation, no rhonchi or wheezes, or rib retractions  Heart: Regular rate and rhythm, no murmurs or gallops Breast:Examined in sitting and supine position were symmetrical in appearance, no palpable masses or tenderness,  no skin retraction, no nipple inversion, no nipple discharge, no skin discoloration, no axillary or supraclavicular lymphadenopathy Abdomen: no palpable masses or tenderness, no rebound or guarding Extremities:evidence of upper lower extremity spider varicose veins bilateral   Pelvic:  Bartholin, Urethra, Skene Glands: Within normal limits             Vagina: No gross lesions or discharge  Cervix: No gross lesions or discharge  Uterus  anteverted, normal  size, shape and consistency, non-tender and mobile  Adnexa  Without masses or tenderness  Anus and perineum  normal   Rectovaginal  normal sphincter tone without palpated masses or tenderness             Hemoccult not indicated     Assessment/Plan:  42 y.o. female for annual exam Will be referred to the pain clinic for possible injection of the spider varicose veins is are causing her discomfort. Because of her decreased libido we'll check a total and free testosterone  level. A Pap smear was done today. The new guidelines were discussed. She received a Tdap vaccine today. Last bone density study was in 2013 lowest core was in the AP spine and -2.0. Patient was encouraged to continue to take her calcium and vitamin D with regular exercise for osteoporosis prevention. For patient's sinusitis she will be placed on Ceftin 500 mg one by mouth twice a day for 7 days. She was reminded to do her monthly self breast examination. Prescription refill for Junel 1/20 was provided.    Ok Edwards MD, 5:57 PM 04/10/2013

## 2013-04-10 NOTE — Patient Instructions (Addendum)

## 2013-04-11 LAB — TESTOSTERONE, FREE, TOTAL, SHBG
Sex Hormone Binding: 46 nmol/L (ref 18–114)
Testosterone-% Free: 1.5 % (ref 0.4–2.4)
Testosterone: 41 ng/dL (ref 10–70)

## 2013-04-13 ENCOUNTER — Encounter: Payer: Self-pay | Admitting: Gynecology

## 2013-04-20 ENCOUNTER — Encounter: Payer: Self-pay | Admitting: Gynecology

## 2013-04-29 ENCOUNTER — Ambulatory Visit (INDEPENDENT_AMBULATORY_CARE_PROVIDER_SITE_OTHER): Payer: BC Managed Care – PPO | Admitting: Physician Assistant

## 2013-04-29 VITALS — BP 129/89 | HR 83 | Temp 98.2°F | Resp 16 | Ht 63.5 in | Wt 190.0 lb

## 2013-04-29 DIAGNOSIS — H6983 Other specified disorders of Eustachian tube, bilateral: Secondary | ICD-10-CM

## 2013-04-29 DIAGNOSIS — H6692 Otitis media, unspecified, left ear: Secondary | ICD-10-CM

## 2013-04-29 DIAGNOSIS — H9209 Otalgia, unspecified ear: Secondary | ICD-10-CM

## 2013-04-29 DIAGNOSIS — H9201 Otalgia, right ear: Secondary | ICD-10-CM

## 2013-04-29 DIAGNOSIS — H6990 Unspecified Eustachian tube disorder, unspecified ear: Secondary | ICD-10-CM

## 2013-04-29 DIAGNOSIS — H6993 Unspecified Eustachian tube disorder, bilateral: Secondary | ICD-10-CM

## 2013-04-29 DIAGNOSIS — H6980 Other specified disorders of Eustachian tube, unspecified ear: Secondary | ICD-10-CM

## 2013-04-29 DIAGNOSIS — H6690 Otitis media, unspecified, unspecified ear: Secondary | ICD-10-CM

## 2013-04-29 MED ORDER — AMOXICILLIN 500 MG PO CAPS: 1000.0000 mg | ORAL_CAPSULE | Freq: Three times a day (TID) | ORAL | Status: DC

## 2013-04-29 MED ORDER — HYDROCODONE-ACETAMINOPHEN 5-325 MG PO TABS: 1.0000 | ORAL_TABLET | Freq: Three times a day (TID) | ORAL | Status: DC | PRN

## 2013-04-29 MED ORDER — FLUTICASONE PROPIONATE 50 MCG/ACT NA SUSP: 2.0000 | Freq: Every day | NASAL | Status: DC

## 2013-04-29 NOTE — Progress Notes (Signed)
  Subjective:    Patient ID: Karen Peterson, female    DOB: 15-Sep-1971, 42 y.o.   MRN: [id number]  HPI 42 year old female presents with acute onset of left ear pain.  States symptoms started yesterday and have progressively worsened today and also have now begun to affect the right ear.  Says the pain in her right ear is radiating down now neck and also up to the frontal part of her head.  Admits to intermittent nasal congestion and rhinorrhea since the start of Spring.  Denies fever, chills, nausea, vomiting, tinnitus, hearing loss, vision changes, or dizziness.     Review of Systems  Constitutional: Negative for fever and chills.  HENT: Positive for ear pain, congestion, rhinorrhea, postnasal drip and sinus pressure (on left frontal side). Negative for hearing loss, sore throat and ear discharge.   Eyes: Negative for visual disturbance.  Respiratory: Negative for cough.   Gastrointestinal: Negative for nausea, vomiting and abdominal pain.  Neurological: Positive for headaches. Negative for dizziness.       Objective:   Physical Exam  Constitutional: She is oriented to person, place, and time. She appears well-developed and well-nourished.  HENT:  Head: Normocephalic and atraumatic.  Right Ear: Hearing, tympanic membrane, external ear and ear canal normal.  Left Ear: Hearing, external ear and ear canal normal. No drainage or swelling. Tympanic membrane is erythematous and bulging. Tympanic membrane is not perforated.  Mouth/Throat: Oropharynx is clear and moist. No oropharyngeal exudate.  Eyes: Conjunctivae are normal.  Neck: Normal range of motion. Neck supple.  Cardiovascular: Normal rate and normal heart sounds.   Pulmonary/Chest: Effort normal and breath sounds normal.  Lymphadenopathy:    She has no cervical adenopathy.  Neurological: She is alert and oriented to person, place, and time.  Psychiatric: She has a normal mood and affect. Her behavior is normal. Judgment and thought  content normal.          Assessment & Plan:  Otitis media, left - Plan: amoxicillin (AMOXIL) 500 MG capsule  Otalgia of right ear - Plan: HYDROcodone-acetaminophen (NORCO) 5-325 MG per tablet  ETD (eustachian tube dysfunction), bilateral - Plan: fluticasone (FLONASE) 50 MCG/ACT nasal spray  Amoxicillin 1 gram tid x 10 days Flonase twice daily to help with rhinorrhea and ETD. May use Norco q8hours as needed for pain - use sparingly, may cause sedation Follow up if symptoms worsen or fail to improve.

## 2013-07-25 ENCOUNTER — Ambulatory Visit (INDEPENDENT_AMBULATORY_CARE_PROVIDER_SITE_OTHER): Payer: BC Managed Care – PPO | Admitting: Physician Assistant

## 2013-07-25 VITALS — BP 112/83 | HR 88 | Temp 98.1°F | Resp 18 | Wt 191.0 lb

## 2013-07-25 DIAGNOSIS — B9689 Other specified bacterial agents as the cause of diseases classified elsewhere: Secondary | ICD-10-CM

## 2013-07-25 DIAGNOSIS — N760 Acute vaginitis: Secondary | ICD-10-CM

## 2013-07-25 DIAGNOSIS — R300 Dysuria: Secondary | ICD-10-CM

## 2013-07-25 LAB — POCT URINALYSIS DIPSTICK
Bilirubin, UA: NEGATIVE
Blood, UA: NEGATIVE
Glucose, UA: NEGATIVE
Ketones, UA: NEGATIVE
Leukocytes, UA: NEGATIVE
Nitrite, UA: NEGATIVE
Protein, UA: NEGATIVE
Spec Grav, UA: >=1.03
Urobilinogen, UA: 1
pH, UA: 6

## 2013-07-25 LAB — POCT WET PREP WITH KOH
KOH Prep POC: NEGATIVE
Trichomonas, UA: NEGATIVE
Yeast Wet Prep HPF POC: NEGATIVE

## 2013-07-25 LAB — POCT UA - MICROSCOPIC ONLY
Casts, Ur, LPF, POC: NEGATIVE
Yeast, UA: NEGATIVE

## 2013-07-25 MED ORDER — METRONIDAZOLE 500 MG PO TABS: 500.0000 mg | ORAL_TABLET | Freq: Two times a day (BID) | ORAL | Status: DC

## 2013-07-25 NOTE — Patient Instructions (Signed)
Bacterial Vaginosis Bacterial vaginosis (BV) is a vaginal infection where the normal balance of bacteria in the vagina is disrupted. The normal balance is then replaced by an overgrowth of certain bacteria. There are several different kinds of bacteria that can cause BV. BV is the most common vaginal infection in women of childbearing age. CAUSES   The cause of BV is not fully understood. BV develops when there is an increase or imbalance of harmful bacteria.  Some activities or behaviors can upset the normal balance of bacteria in the vagina and put women at increased risk including:  Having a new sex partner or multiple sex partners.  Douching.  Using an intrauterine device (IUD) for contraception.  It is not clear what role sexual activity plays in the development of BV. However, women that have never had sexual intercourse are rarely infected with BV. Women do not get BV from toilet seats, bedding, swimming pools or from touching objects around them.  SYMPTOMS   Grey vaginal discharge.  A fish-like odor with discharge, especially after sexual intercourse.  Itching or burning of the vagina and vulva.  Burning or pain with urination.  Some women have no signs or symptoms at all. DIAGNOSIS  Your caregiver must examine the vagina for signs of BV. Your caregiver will perform lab tests and look at the sample of vaginal fluid through a microscope. They will look for bacteria and abnormal cells (clue cells), a pH test higher than 4.5, and a positive amine test all associated with BV.  RISKS AND COMPLICATIONS   Pelvic inflammatory disease (PID).  Infections following gynecology surgery.  Developing HIV.  Developing herpes virus. TREATMENT  Sometimes BV will clear up without treatment. However, all women with symptoms of BV should be treated to avoid complications, especially if gynecology surgery is planned. Female partners generally do not need to be treated. However, BV may spread  between female sex partners so treatment is helpful in preventing a recurrence of BV.   BV may be treated with antibiotics. The antibiotics come in either pill or vaginal cream forms. Either can be used with nonpregnant or pregnant women, but the recommended dosages differ. These antibiotics are not harmful to the baby.  BV can recur after treatment. If this happens, a second round of antibiotics will often be prescribed.  Treatment is important for pregnant women. If not treated, BV can cause a premature delivery, especially for a pregnant woman who had a premature birth in the past. All pregnant women who have symptoms of BV should be checked and treated.  For chronic reoccurrence of BV, treatment with a type of prescribed gel vaginally twice a week is helpful. HOME CARE INSTRUCTIONS   Finish all medication as directed by your caregiver.  Do not have sex until treatment is completed.  Tell your sexual partner that you have a vaginal infection. They should see their caregiver and be treated if they have problems, such as a mild rash or itching.  Practice safe sex. Use condoms. Only have 1 sex partner. PREVENTION  Basic prevention steps can help reduce the risk of upsetting the natural balance of bacteria in the vagina and developing BV:  Do not have sexual intercourse (be abstinent).  Do not douche.  Use all of the medicine prescribed for treatment of BV, even if the signs and symptoms go away.  Tell your sex partner if you have BV. That way, they can be treated, if needed, to prevent reoccurrence. SEEK MEDICAL CARE IF:     Your symptoms are not improving after 3 days of treatment.  You have increased discharge, pain, or fever. MAKE SURE YOU:   Understand these instructions.  Will watch your condition.  Will get help right away if you are not doing well or get worse. FOR MORE INFORMATION  Division of STD Prevention (DSTDP), Centers for Disease Control and Prevention:  www.cdc.gov/std American [employer] Health Association (ASHA): www.ashastd.org  Document Released: 11/09/2005 Document Revised: 02/01/2012 Document Reviewed: 05/02/2009 [employer] Patient Information 2014 [employer].  

## 2013-07-25 NOTE — Progress Notes (Signed)
Subjective:    Patient ID: Karen Peterson, female    DOB: 1971/11/07, 41 y.o.   MRN: [id number]  HPI  This 42 y.o. female presents for evaluation of urinary discomfort.  Several weeks ago, was seen for an ear infection and treated with an oral antibiotic.  Last week, she developed vaginal itching.  She used an OTC Monistat treatment without significant improvement.  No atypical vaginal discharge.  Medications, allergies, past medical history, surgical history, family history, social history and problem list reviewed.   Review of Systems As above.    Objective:   Physical Exam  Vitals reviewed. Constitutional: She is oriented to person, place, and time. Vital signs are normal. She appears well-developed and well-nourished. She is active and cooperative. No distress.  HENT:  Head: Normocephalic and atraumatic.  Cardiovascular: Normal rate, regular rhythm and normal heart sounds.   Pulmonary/Chest: Effort normal and breath sounds normal.  Abdominal: Soft. Normal appearance and bowel sounds are normal. She exhibits no distension and no mass. There is no hepatosplenomegaly. There is no tenderness. There is no rigidity, no rebound, no guarding, no CVA tenderness, no tenderness at McBurney's point and negative Murphy's sign. No hernia.  Musculoskeletal: Normal range of motion.       Lumbar back: Normal.  Neurological: She is alert and oriented to person, place, and time.  Skin: Skin is warm and dry. No rash noted. She is not diaphoretic. No pallor.  Psychiatric: She has a normal mood and affect. Her speech is normal and behavior is normal. Judgment normal.     Results for orders placed in visit on 07/25/13  POCT UA - MICROSCOPIC ONLY      Result Value Range   WBC, Ur, HPF, POC 1-4     RBC, urine, microscopic 6-7     Bacteria, U Microscopic 2+     Mucus, UA pos     Epithelial cells, urine per micros 1-2     Crystals, Ur, HPF, POC calcium oxalate     Casts, Ur, LPF, POC neg     Yeast, UA neg    POCT URINALYSIS DIPSTICK      Result Value Range   Color, UA yellow     Clarity, UA clear     Glucose, UA neg     Bilirubin, UA neg     Ketones, UA neg     Spec Grav, UA >=1.030     Blood, UA neg     pH, UA 6.0     Protein, UA neg     Urobilinogen, UA 1.0     Nitrite, UA neg     Leukocytes, UA Negative    POCT WET PREP WITH KOH      Result Value Range   Trichomonas, UA Negative     Clue Cells Wet Prep HPF POC tntc     Epithelial Wet Prep HPF POC 1-2     Yeast Wet Prep HPF POC neg     Bacteria Wet Prep HPF POC 2+     RBC Wet Prep HPF POC 5-6     WBC Wet Prep HPF POC 2-3     KOH Prep POC Negative          Assessment & Plan:  Dysuria - Plan: POCT UA - Microscopic Only, POCT urinalysis dipstick, POCT Wet Prep with KOH  BV (bacterial vaginosis) - Plan: metroNIDAZOLE (FLAGYL) 500 MG tablet  Anticipatory guidance, supportive care.  RTC PRN.  Fernande Bras, PA-C Physician Assistant-Certified  [facility]

## 2013-10-21 ENCOUNTER — Ambulatory Visit (INDEPENDENT_AMBULATORY_CARE_PROVIDER_SITE_OTHER): Payer: BC Managed Care – PPO | Admitting: Internal Medicine

## 2013-10-21 VITALS — BP 126/78 | HR 87 | Temp 98.1°F | Resp 18 | Ht 63.0 in | Wt 193.0 lb

## 2013-10-21 DIAGNOSIS — J019 Acute sinusitis, unspecified: Secondary | ICD-10-CM

## 2013-10-21 MED ORDER — AMOXICILLIN 875 MG PO TABS: 875.0000 mg | ORAL_TABLET | Freq: Two times a day (BID) | ORAL | Status: DC

## 2013-10-21 NOTE — Progress Notes (Signed)
Subjective:    Patient ID: Karen Peterson, female    DOB: 1970/12/28, 42 y.o.   MRN: [id number] This chart was scribed for Ellamae Sia, MD by Clydene Laming, ED Scribe. This patient was seen in room4 and the patient's care was started at 10:19 AM. HPI HPI Comments: Karen Peterson is a 42 y.o. female who presents to the [facility] complaining of what she says is "sinus infection" symptoms onset a few days ago. Pt states she is having some congestion with associated drainage, ear pain and dizziness. Pt denies cough or sore throat.  Patient Active Problem List   Diagnosis Date Noted  . Hyperprolactinemia 04/10/2013  . Type II diabetes mellitus 03/24/2012  . Hypercholesterolemia 03/24/2012  . Premature menopause 03/24/2012  . Insomnia    Past Medical History  Diagnosis Date  . Hyperprolactinemia   . Diabetes mellitus     TYPE II  . Insomnia   . Vitamin D deficiency   . High cholesterol   . Premature ovarian failure    Past Surgical History  Procedure Laterality Date  . Tubal ligation      PPTL  . Cholecystectomy     No Known Allergies Prior to Admission medications   Medication Sig Start Date End Date Taking? Authorizing Provider  bromocriptine (PARLODEL) 2.5 MG tablet Take 2.5 mg by mouth 2 (two) times a week. Take 1/2 on Monday and Friday   Yes Historical Provider, MD  fluticasone (FLONASE) 50 MCG/ACT nasal spray Place 2 sprays into the nose daily. 04/29/13  Yes Heather M Marte, PA-C  Saxagliptin-Metformin 03-999 MG TB24 Take by mouth 2 (two) times daily.    Yes Historical Provider, MD  simvastatin (ZOCOR) 40 MG tablet Take 40 mg by mouth every evening.   Yes Historical Provider, MD  amoxicillin (AMOXIL) 875 MG tablet Take 1 tablet (875 mg total) by mouth 2 (two) times daily. 10/21/13   Tonye Pearson, MD  Norethindrone Acetate-Ethinyl Estradiol (JUNEL,LOESTRIN,MICROGESTIN) 1.5-30 MG-MCG tablet Take 1 tablet by mouth daily. 03/24/12 07/25/13  Ok Edwards, MD  zolpidem (AMBIEN) 10 MG tablet Take 10 mg by mouth at bedtime as needed.    Historical Provider, MD   History   Social History  . Marital Status: Single    Spouse Name: N/A    Number of Children: N/A  . Years of Education: N/A   Occupational History  . Not on file.   Social History Main Topics  . Smoking status: Never Smoker   . Smokeless tobacco: Never Used  . Alcohol Use: No  . Drug Use: No  . Sexual Activity: Yes    Birth Control/ Protection: None, Pill   Other Topics Concern  . Not on file   Social History Narrative  . No narrative on file     Review of Systems  Constitutional: Negative for fever and chills.  HENT: Positive for congestion, ear pain and sinus pressure. Negative for sore throat.   Respiratory: Negative for cough.        Objective:   Physical Exam Filed Vitals:   10/21/13 0911  BP: 126/78  Pulse: 87  Temp: 98.1 F (36.7 C)  Resp: 18  Height: 5\' 3"  (1.6 m)  Weight: 193 lb (87.544 kg)  SpO2: 98%  tms cl Nares puru Tend max sin L No nodes thr cl Lungs cl        Assessment & Plan:  10:24 AM- Discussed treatment plan with pt at bedside.  Pt verbalized understanding and agreement with plan.  I have completed the patient encounter in its entirety as documented by the scribe, with editing by me where necessary. Tyrika Newman P. Merla Riches, M.D.  sinusitis Meds ordered this encounter  Medications  . amoxicillin (AMOXIL) 875 MG tablet    Sig: Take 1 tablet (875 mg total) by mouth 2 (two) times daily.    Dispense:  20 tablet    Refill:  0

## 2014-03-01 ENCOUNTER — Other Ambulatory Visit: Payer: Self-pay

## 2014-03-01 DIAGNOSIS — Z1231 Encounter for screening mammogram for malignant neoplasm of breast: Secondary | ICD-10-CM

## 2014-03-05 ENCOUNTER — Ambulatory Visit
Admission: RE | Admit: 2014-03-05 | Discharge: 2014-03-05 | Disposition: A | Discharge status: Home / Self Care | Payer: BC Managed Care – PPO | Source: Ambulatory Visit

## 2014-03-05 DIAGNOSIS — Z1231 Encounter for screening mammogram for malignant neoplasm of breast: Secondary | ICD-10-CM

## 2014-03-07 ENCOUNTER — Other Ambulatory Visit: Payer: Self-pay | Admitting: Gynecology

## 2014-03-07 DIAGNOSIS — R928 Other abnormal and inconclusive findings on diagnostic imaging of breast: Secondary | ICD-10-CM

## 2014-03-16 ENCOUNTER — Encounter (INDEPENDENT_AMBULATORY_CARE_PROVIDER_SITE_OTHER): Payer: Self-pay

## 2014-03-16 ENCOUNTER — Ambulatory Visit
Admission: RE | Admit: 2014-03-16 | Discharge: 2014-03-16 | Disposition: A | Discharge status: Home / Self Care | Payer: BC Managed Care – PPO | Source: Ambulatory Visit | Attending: Gynecology | Admitting: Gynecology

## 2014-03-16 DIAGNOSIS — R928 Other abnormal and inconclusive findings on diagnostic imaging of breast: Secondary | ICD-10-CM

## 2014-03-18 ENCOUNTER — Ambulatory Visit (INDEPENDENT_AMBULATORY_CARE_PROVIDER_SITE_OTHER): Payer: BC Managed Care – PPO | Admitting: Family Medicine

## 2014-03-18 VITALS — BP 122/76 | HR 84 | Temp 98.0°F | Resp 18 | Ht 62.0 in | Wt 193.8 lb

## 2014-03-18 DIAGNOSIS — J309 Allergic rhinitis, unspecified: Secondary | ICD-10-CM

## 2014-03-18 DIAGNOSIS — J3489 Other specified disorders of nose and nasal sinuses: Secondary | ICD-10-CM

## 2014-03-18 NOTE — Progress Notes (Signed)
Subjective:  This chart was scribed for Karen Ray, MD by Karen Peterson, Medical Scribe. This patient was seen in Room 13 and the patient's care was started at 9:08 AM.   Patient ID: Karen Peterson, female    DOB: [birth year], 43 y.o.   MRN: [id number]  HPI HPI Comments: Karen Peterson is a 43 y.o. female who presents to the [facility] complaining of constant left ear pain, headache, and sinus pressure that started a couple of days ago.  She lists postnasal drip and fatigue as associated symptoms.  She denies SOB and fever an associated symptoms.  The patient states that she has taken Sudafed for her symptoms with intermittent relief to her symptoms.  She states that she uses Flonase only when she is very congested.  She states that she has used it twice within the last week.  She denies taking Zyrtec, Allegra, or Claritin for her symptoms.  The patient denies taking Tylenol or Motrin to treat her ear pain.  She states that her grandchildren have exhibited allergy-related symptoms.  She denies any sick contacts.     Patient Active Problem List   Diagnosis Date Noted  . Hyperprolactinemia 04/10/2013  . Type II diabetes mellitus 03/24/2012  . Hypercholesterolemia 03/24/2012  . Premature menopause 03/24/2012  . Insomnia    Past Medical History  Diagnosis Date  . Hyperprolactinemia   . Diabetes mellitus     TYPE II  . Insomnia   . Vitamin D deficiency   . High cholesterol   . Premature ovarian failure    Past Surgical History  Procedure Laterality Date  . Tubal ligation      PPTL  . Cholecystectomy     No Known Allergies Prior to Admission medications   Medication Sig Start Date End Date Taking? Authorizing Provider  bromocriptine (PARLODEL) 2.5 MG tablet Take 2.5 mg by mouth 2 (two) times a week. Take 1/2 on Monday and Friday   Yes Historical Provider, MD  fluticasone (FLONASE) 50 MCG/ACT nasal spray Place 2 sprays into the nose daily. 04/29/13  Yes Heather  M Marte, PA-C  glimepiride (AMARYL) 2 MG tablet Take 2 mg by mouth daily with breakfast.   Yes Historical Provider, MD  Norethindrone Acetate-Ethinyl Estradiol (JUNEL,LOESTRIN,MICROGESTIN) 1.5-30 MG-MCG tablet Take 1 tablet by mouth daily. 03/24/12 03/18/14 Yes Terrance Mass, MD  simvastatin (ZOCOR) 40 MG tablet Take 40 mg by mouth every evening.   Yes Historical Provider, MD   History   Social History  . Marital Status: Single    Spouse Name: Karen Peterson    Number of Children: Karen Peterson  . Years of Education: Karen Peterson   Occupational History  . Not on file.   Social History Main Topics  . Smoking status: Never Smoker   . Smokeless tobacco: Never Used  . Alcohol Use: No  . Drug Use: No  . Sexual Activity: Yes    Birth Control/ Protection: None, Pill   Other Topics Concern  . Not on file   Social History Narrative  . No narrative on file     Review of Systems  Constitutional: Positive for fatigue. Negative for fever.  HENT: Positive for congestion, ear pain (left), postnasal drip and sinus pressure.   Respiratory: Negative for shortness of breath.   Neurological: Positive for headaches (frontal).     Objective:  Physical Exam  Vitals reviewed. Constitutional: She is oriented to person, place, and time. She appears well-developed and well-nourished. No distress.  HENT:  Head: Normocephalic and atraumatic.  Right Ear: Hearing, tympanic membrane, external ear and ear canal normal.  Left Ear: Hearing, tympanic membrane, external ear and ear canal normal.  Nose: Right sinus exhibits frontal sinus tenderness. Left sinus exhibits frontal sinus tenderness.  Mouth/Throat: Oropharynx is clear and moist. No oropharyngeal exudate.  Clear fluid behind the left ear.  Eyes: Conjunctivae and EOM are normal. Pupils are equal, round, and reactive to light.  Neck: Carotid bruit is not present.  Cardiovascular: Normal rate, regular rhythm, normal heart sounds and intact distal pulses.   No murmur  heard. Pulmonary/Chest: Effort normal and breath sounds normal. No respiratory distress. She has no wheezes. She has no rhonchi.  Abdominal: Soft. She exhibits no pulsatile midline mass. There is no tenderness.  Neurological: She is alert and oriented to person, place, and time.  Skin: Skin is warm and dry. No rash noted.  Psychiatric: She has a normal mood and affect. Her behavior is normal.     Filed Vitals:   03/18/14 0823  BP: 122/76  Pulse: 84  Temp: 98 F (36.7 C)  TempSrc: Oral  Resp: 18  Height: 5' 2"  (1.575 m)  Weight: 193 lb 12.8 oz (87.907 kg)  SpO2: 98%   Assessment & Plan:   [name] is a 43 y.o. female Allergic rhinitis  Sinus pressure  Suspect allergic rhinitis as primary cause vs. Virus. Sx care as below and rtc precautions discussed.    Patient Instructions  Over the counter claritin or zyrtec for allergies, and flonase nasal spray each day.  For pressure in sinuses, you can use Afrin up to 3 days. Return to the clinic or go to the nearest emergency room if any of your symptoms worsen or new symptoms occur. Allergies Allergies may happen from anything your body is sensitive to. This may be food, medicines, pollens, chemicals, and nearly anything around you in everyday life that produces allergens. An allergen is anything that causes an allergy producing substance. Heredity is often a factor in causing these problems. This means you may have some of the same allergies as your parents. Food allergies happen in all age groups. Food allergies are some of the most severe and life threatening. Some common food allergies are cow's milk, seafood, eggs, nuts, wheat, and soybeans. SYMPTOMS   Swelling around the mouth.  An itchy red rash or hives.  Vomiting or diarrhea.  Difficulty breathing. SEVERE ALLERGIC REACTIONS ARE LIFE-THREATENING. This reaction is called anaphylaxis. It can cause the mouth and throat to swell and cause difficulty with breathing and  swallowing. In severe reactions only a trace amount of food (for example, peanut oil in a salad) may cause death within seconds. Seasonal allergies occur in all age groups. These are seasonal because they usually occur during the same season every year. They may be a reaction to molds, grass pollens, or tree pollens. Other causes of problems are house dust mite allergens, pet dander, and mold spores. The symptoms often consist of nasal congestion, a runny itchy nose associated with sneezing, and tearing itchy eyes. There is often an associated itching of the mouth and ears. The problems happen when you come in contact with pollens and other allergens. Allergens are the particles in the air that the body reacts to with an allergic reaction. This causes you to release allergic antibodies. Through a chain of events, these eventually cause you to release histamine into the blood stream. Although it is meant to be protective to  the body, it is this release that causes your discomfort. This is why you were given anti-histamines to feel better. If you are unable to pinpoint the offending allergen, it may be determined by skin or blood testing. Allergies cannot be cured but can be controlled with medicine. Hay fever is a collection of all or some of the seasonal allergy problems. It may often be treated with simple over-the-counter medicine such as diphenhydramine. Take medicine as directed. Do not drink alcohol or drive while taking this medicine. Check with your caregiver or package insert for child dosages. If these medicines are not effective, there are many new medicines your caregiver can prescribe. Stronger medicine such as nasal spray, eye drops, and corticosteroids may be used if the first things you try do not work well. Other treatments such as immunotherapy or desensitizing injections can be used if all else fails. Follow up with your caregiver if problems continue. These seasonal allergies are usually not  life threatening. They are generally more of a nuisance that can often be handled using medicine. HOME CARE INSTRUCTIONS   If unsure what causes a reaction, keep a diary of foods eaten and symptoms that follow. Avoid foods that cause reactions.  If hives or rash are present:  Take medicine as directed.  You may use an over-the-counter antihistamine (diphenhydramine) for hives and itching as needed.  Apply cold compresses (cloths) to the skin or take baths in cool water. Avoid hot baths or showers. Heat will make a rash and itching worse.  If you are severely allergic:  Following a treatment for a severe reaction, hospitalization is often required for closer follow-up.  Wear a medic-alert bracelet or necklace stating the allergy.  You and your family must learn how to give adrenaline or use an anaphylaxis kit.  If you have had a severe reaction, always carry your anaphylaxis kit or EpiPen with you. Use this medicine as directed by your caregiver if a severe reaction is occurring. Failure to do so could have a fatal outcome. SEEK MEDICAL CARE IF:  You suspect a food allergy. Symptoms generally happen within 30 minutes of eating a food.  Your symptoms have not gone away within 2 days or are getting worse.  You develop new symptoms.  You want to retest yourself or your child with a food or drink you think causes an allergic reaction. Never do this if an anaphylactic reaction to that food or drink has happened before. Only do this under the care of a caregiver. SEEK IMMEDIATE MEDICAL CARE IF:   You have difficulty breathing, are wheezing, or have a tight feeling in your chest or throat.  You have a swollen mouth, or you have hives, swelling, or itching all over your body.  You have had a severe reaction that has responded to your anaphylaxis kit or an EpiPen. These reactions may return when the medicine has worn off. These reactions should be considered life threatening. MAKE SURE  YOU:   Understand these instructions.  Will watch your condition.  Will get help right away if you are not doing well or get worse. Document Released: 02/02/2003 Document Revised: 03/06/2013 Document Reviewed: 07/09/2008 [facility] Patient Information 2014 [employer].     I personally performed the services described in this documentation, which was scribed in my presence. The recorded information has been reviewed and considered, and addended by me as needed.

## 2014-03-18 NOTE — Patient Instructions (Signed)
Over the counter claritin or zyrtec for allergies, and flonase nasal spray each day.  For pressure in sinuses, you can use Afrin up to 3 days. Return to the clinic or go to the nearest emergency room if any of your symptoms worsen or new symptoms occur. Allergies Allergies may happen from anything your body is sensitive to. This may be food, medicines, pollens, chemicals, and nearly anything around you in everyday life that produces allergens. An allergen is anything that causes an allergy producing substance. Heredity is often a factor in causing these problems. This means you may have some of the same allergies as your parents. Food allergies happen in all age groups. Food allergies are some of the most severe and life threatening. Some common food allergies are cow's milk, seafood, eggs, nuts, wheat, and soybeans. SYMPTOMS   Swelling around the mouth.  An itchy red rash or hives.  Vomiting or diarrhea.  Difficulty breathing. SEVERE ALLERGIC REACTIONS ARE LIFE-THREATENING. This reaction is called anaphylaxis. It can cause the mouth and throat to swell and cause difficulty with breathing and swallowing. In severe reactions only a trace amount of food (for example, peanut oil in a salad) may cause death within seconds. Seasonal allergies occur in all age groups. These are seasonal because they usually occur during the same season every year. They may be a reaction to molds, grass pollens, or tree pollens. Other causes of problems are house dust mite allergens, pet dander, and mold spores. The symptoms often consist of nasal congestion, a runny itchy nose associated with sneezing, and tearing itchy eyes. There is often an associated itching of the mouth and ears. The problems happen when you come in contact with pollens and other allergens. Allergens are the particles in the air that the body reacts to with an allergic reaction. This causes you to release allergic antibodies. Through a chain of  events, these eventually cause you to release histamine into the blood stream. Although it is meant to be protective to the body, it is this release that causes your discomfort. This is why you were given anti-histamines to feel better. If you are unable to pinpoint the offending allergen, it may be determined by skin or blood testing. Allergies cannot be cured but can be controlled with medicine. Hay fever is a collection of all or some of the seasonal allergy problems. It may often be treated with simple over-the-counter medicine such as diphenhydramine. Take medicine as directed. Do not drink alcohol or drive while taking this medicine. Check with your caregiver or package insert for child dosages. If these medicines are not effective, there are many new medicines your caregiver can prescribe. Stronger medicine such as nasal spray, eye drops, and corticosteroids may be used if the first things you try do not work well. Other treatments such as immunotherapy or desensitizing injections can be used if all else fails. Follow up with your caregiver if problems continue. These seasonal allergies are usually not life threatening. They are generally more of a nuisance that can often be handled using medicine. HOME CARE INSTRUCTIONS   If unsure what causes a reaction, keep a diary of foods eaten and symptoms that follow. Avoid foods that cause reactions.  If hives or rash are present:  Take medicine as directed.  You may use an over-the-counter antihistamine (diphenhydramine) for hives and itching as needed.  Apply cold compresses (cloths) to the skin or take baths in cool water. Avoid hot baths or showers. Heat will make a  rash and itching worse.  If you are severely allergic:  Following a treatment for a severe reaction, hospitalization is often required for closer follow-up.  Wear a medic-alert bracelet or necklace stating the allergy.  You and your family must learn how to give adrenaline or use  an anaphylaxis kit.  If you have had a severe reaction, always carry your anaphylaxis kit or EpiPen with you. Use this medicine as directed by your caregiver if a severe reaction is occurring. Failure to do so could have a fatal outcome. SEEK MEDICAL CARE IF:  You suspect a food allergy. Symptoms generally happen within 30 minutes of eating a food.  Your symptoms have not gone away within 2 days or are getting worse.  You develop new symptoms.  You want to retest yourself or your child with a food or drink you think causes an allergic reaction. Never do this if an anaphylactic reaction to that food or drink has happened before. Only do this under the care of a caregiver. SEEK IMMEDIATE MEDICAL CARE IF:   You have difficulty breathing, are wheezing, or have a tight feeling in your chest or throat.  You have a swollen mouth, or you have hives, swelling, or itching all over your body.  You have had a severe reaction that has responded to your anaphylaxis kit or an EpiPen. These reactions may return when the medicine has worn off. These reactions should be considered life threatening. MAKE SURE YOU:   Understand these instructions.  Will watch your condition.  Will get help right away if you are not doing well or get worse. Document Released: 02/02/2003 Document Revised: 03/06/2013 Document Reviewed: 07/09/2008 [facility] Patient Information 2014 [employer].

## 2014-04-12 ENCOUNTER — Encounter: Payer: Self-pay | Admitting: Gynecology

## 2014-04-12 ENCOUNTER — Ambulatory Visit (INDEPENDENT_AMBULATORY_CARE_PROVIDER_SITE_OTHER): Payer: BC Managed Care – PPO | Admitting: Gynecology

## 2014-04-12 VITALS — BP 126/82 | Ht 62.0 in | Wt 191.0 lb

## 2014-04-12 DIAGNOSIS — Z01419 Encounter for gynecological examination (general) (routine) without abnormal findings: Secondary | ICD-10-CM

## 2014-04-12 DIAGNOSIS — E229 Hyperfunction of pituitary gland, unspecified: Secondary | ICD-10-CM

## 2014-04-12 DIAGNOSIS — G4700 Insomnia, unspecified: Secondary | ICD-10-CM

## 2014-04-12 DIAGNOSIS — E28319 Asymptomatic premature menopause: Secondary | ICD-10-CM

## 2014-04-12 DIAGNOSIS — E221 Hyperprolactinemia: Secondary | ICD-10-CM

## 2014-04-12 MED ORDER — ESZOPICLONE 1 MG PO TABS: 1.0000 mg | ORAL_TABLET | Freq: Every evening | ORAL | Status: DC | PRN

## 2014-04-12 NOTE — Patient Instructions (Signed)
Insomnia Insomnia is frequent trouble falling and/or staying asleep. Insomnia can be a long term problem or a short term problem. Both are common. Insomnia can be a short term problem when the wakefulness is related to a certain stress or worry. Long term insomnia is often related to ongoing stress during waking hours and/or poor sleeping habits. Overtime, sleep deprivation itself can make the problem worse. Every little thing feels more severe because you are overtired and your ability to cope is decreased. CAUSES   Stress, anxiety, and depression.  Poor sleeping habits.  Distractions such as TV in the bedroom.  Naps close to bedtime.  Engaging in emotionally charged conversations before bed.  Technical reading before sleep.  Alcohol and other sedatives. They may make the problem worse. They can hurt normal sleep patterns and normal dream activity.  Stimulants such as caffeine for several hours prior to bedtime.  Pain syndromes and shortness of breath can cause insomnia.  Exercise late at night.  Changing time zones may cause sleeping problems (jet lag). It is sometimes helpful to have someone observe your sleeping patterns. They should look for periods of not breathing during the night (sleep apnea). They should also look to see how long those periods last. If you live alone or observers are uncertain, you can also be observed at a sleep clinic where your sleep patterns will be professionally monitored. Sleep apnea requires a checkup and treatment. Give your caregivers your medical history. Give your caregivers observations your family has made about your sleep.  SYMPTOMS   Not feeling rested in the morning.  Anxiety and restlessness at bedtime.  Difficulty falling and staying asleep. TREATMENT   Your caregiver may prescribe treatment for an underlying medical disorders. Your caregiver can give advice or help if you are using alcohol or other drugs for self-medication. Treatment  of underlying problems will usually eliminate insomnia problems.  Medications can be prescribed for short time use. They are generally not recommended for lengthy use.  Over-the-counter sleep medicines are not recommended for lengthy use. They can be habit forming.  You can promote easier sleeping by making lifestyle changes such as:  Using relaxation techniques that help with breathing and reduce muscle tension.  Exercising earlier in the day.  Changing your diet and the time of your last meal. No night time snacks.  Establish a regular time to go to bed.  Counseling can help with stressful problems and worry.  Soothing music and white noise may be helpful if there are background noises you cannot remove.  Stop tedious detailed work at least one hour before bedtime. HOME CARE INSTRUCTIONS   Keep a diary. Inform your caregiver about your progress. This includes any medication side effects. See your caregiver regularly. Take note of:  Times when you are asleep.  Times when you are awake during the night.  The quality of your sleep.  How you feel the next day. This information will help your caregiver care for you.  Get out of bed if you are still awake after 15 minutes. Read or do some quiet activity. Keep the lights down. Wait until you feel sleepy and go back to bed.  Keep regular sleeping and waking hours. Avoid naps.  Exercise regularly.  Avoid distractions at bedtime. Distractions include watching television or engaging in any intense or detailed activity like attempting to balance the household checkbook.  Develop a bedtime ritual. Keep a familiar routine of bathing, brushing your teeth, climbing into bed at the same   time each night, listening to soothing music. Routines increase the success of falling to sleep faster.  Use relaxation techniques. This can be using breathing and muscle tension release routines. It can also include visualizing peaceful scenes. You can  also help control troubling or intruding thoughts by keeping your mind occupied with boring or repetitive thoughts like the old concept of counting sheep. You can make it more creative like imagining planting one beautiful flower after another in your backyard garden.  During your day, work to eliminate stress. When this is not possible use some of the previous suggestions to help reduce the anxiety that accompanies stressful situations. MAKE SURE YOU:   Understand these instructions.  Will watch your condition.  Will get help right away if you are not doing well or get worse. Document Released: 11/06/2000 Document Revised: 02/01/2012 Document Reviewed: 12/07/2007 [employer] Patient Information 2014 [employer]. Eszopiclone tablets What is this medicine? ESZOPICLONE (es ZOE pi clone) is used to treat insomnia. This medicine helps you to fall asleep and sleep through the night. This medicine may be used for other purposes; ask your health care provider or pharmacist if you have questions. COMMON BRAND NAME(S): Lunesta What should I tell my health care provider before I take this medicine? They need to know if you have any of these conditions: -depression -history of a drug or alcohol abuse problem -liver disease -lung or breathing disease -suicidal thoughts -an unusual or allergic reaction to eszopiclone, other medicines, foods, dyes, or preservatives -pregnant or trying to get pregnant -breast-feeding How should I use this medicine? Take this medicine by mouth with a glass of water. Follow the directions on the prescription label. It is better to take this medicine on an empty stomach and only when you are ready for bed. Do not take your medicine more often than directed. If you have been taking this medicine for several weeks and suddenly stop taking it, you may get unpleasant withdrawal symptoms. Your doctor or health care professional may want to gradually reduce the dose. Do not  stop taking this medicine on your own. Always follow your doctor or health care professional's advice. Talk to your pediatrician regarding the use of this medicine in children. Special care may be needed. Overdosage: If you think you have taken too much of this medicine contact a poison control center or emergency room at once. NOTE: This medicine is only for you. Do not share this medicine with others. What if I miss a dose? This does not apply. This medicine should only be taken immediately before going to sleep. Do not take double or extra doses. What may interact with this medicine? -herbal medicines like kava kava, melatonin, St. John's wort and valerian -lorazepam -medicines for fungal infections like ketoconazole, fluconazole, or itraconazole -olanzapine This list may not describe all possible interactions. Give your health care provider a list of all the medicines, herbs, non-prescription drugs, or dietary supplements you use. Also tell them if you smoke, drink alcohol, or use illegal drugs. Some items may interact with your medicine. What should I watch for while using this medicine? Visit your doctor or health care professional for regular checks on your progress. Keep a regular sleep schedule by going to bed at about the same time nightly. Avoid caffeine-containing drinks in the evening hours, as caffeine can cause trouble with falling asleep. Talk to your doctor if you still have trouble sleeping. Do not take this medicine unless you are able to get a full night's   sleep before you must be active again. You may not be able to remember things that you do in the hours after you take this medicine. Some people have reported driving, making phone calls, or preparing and eating food while asleep after taking sleep medicine. Take this medicine right before going to sleep. Tell your doctor if you have any problems with your memory. After you stop taking this medicine, you may notice some trouble  falling asleep. This is called rebound insomnia. This problem usually goes away on its own after 1 or 2 nights. You may get drowsy or dizzy. Do not drive, use machinery, or do anything that needs mental alertness until you know how this medicine affects you. Do not stand or sit up quickly, especially if you are an older patient. This reduces the risk of dizzy or fainting spells. Alcohol may interfere with the effect of this medicine. Avoid alcoholic drinks. This medicine may cause a decrease in mental alertness the day after use, even if you feel that you are fully awake. Tell your doctor if you will need to perform activities requiring full alertness, such as driving, the next day after you have taken this medicine. What side effects may I notice from receiving this medicine? Side effects that you should report to your doctor or health care professional as soon as possible: -allergic reactions like skin rash, itching or hives, swelling of the face, lips, or tongue -changes in vision -confusion -depressed mood -feeling faint or lightheaded, falls -hallucinations -problems with balance, speaking, walking -restlessness, excitability, or feelings of agitation -unusual activities while asleep like driving, eating, making phone calls Side effects that usually do not require medical attention (report to your doctor or health care professional if they continue or are bothersome): -dizziness, or daytime drowsiness, sometimes called a hangover effect -headache This list may not describe all possible side effects. Call your doctor for medical advice about side effects. You may report side effects to FDA at 1-800-FDA-1088. Where should I keep my medicine? Keep out of the reach of children. This medicine can be abused. Keep your medicine in a safe place to protect it from theft. Do not share this medicine with anyone. Selling or giving away this medicine is dangerous and against the law. Store at room  temperature between 15 and 30 degrees C (59 and 86 degrees F). Throw away any unused medicine after the expiration date. NOTE: This sheet is a summary. It may not cover all possible information. If you have questions about this medicine, talk to your doctor, pharmacist, or health care provider.  2014, [employer]vier/[employer] Standard. (2013-04-06 17:42:58)  

## 2014-04-12 NOTE — Progress Notes (Signed)
Karen Peterson [birth year] [id number]   History:    43 y.o. for annual exam with the only complaint today is of occasional insomnia. Patient with history of premature ovarian failure diagnosed in 2005. Dr. Lurene ShadowBallen has been monitoring her blood sugar as well as her prolactin level in treating her for hypercholesterolemia. All her lab work has been done by her endocrinologist. [facility] and is having normal menstrual cycles. She is taking her calcium and vitamin D daily. She's taking Parlodel 2.5 mg half a tablet 2 times a week. Patient denies any prior history of abnormal Pap smears.  Bone density study done 2013 with the lowest T score of -2.0 at the [facility].   Past medical history,surgical history, family history and social history were all reviewed and documented in the EPIC chart.  Gynecologic History Patient's last menstrual period was 03/27/2014. Contraception: OCP (estrogen/progesterone) Last Pap: 2014. Results were: normal Last mammogram: 2015. Results were: Microcystic left breast scheduled for followup ultrasound later this year  Obstetric History OB History  Gravida Para Term Preterm AB SAB TAB Ectopic Multiple Living  4 4 4       4     # Outcome Date GA Lbr Len/2nd Weight Sex Delivery Anes PTL Lv  4 TRM     F SVD  N Y  3 TRM     F SVD  N Y  2 TRM     M SVD  N Y  1 TRM     F SVD  N Y       ROS: A ROS was performed and pertinent positives and negatives are included in the history.  GENERAL: No fevers or chills. HEENT: No change in vision, no earache, sore throat or sinus congestion. NECK: No pain or stiffness. CARDIOVASCULAR: No chest pain or pressure. No palpitations. PULMONARY: No shortness of breath, cough or wheeze. GASTROINTESTINAL: No abdominal pain, nausea, vomiting or diarrhea, melena or bright red blood per rectum. GENITOURINARY: No urinary frequency, urgency, hesitancy or dysuria. MUSCULOSKELETAL: No joint or muscle pain, no back pain, no recent  trauma. DERMATOLOGIC: No rash, no itching, no lesions. ENDOCRINE: No polyuria, polydipsia, no heat or cold intolerance. No recent change in weight. HEMATOLOGICAL: No anemia or easy bruising or bleeding. NEUROLOGIC: No headache, seizures, numbness, tingling or weakness. PSYCHIATRIC: No depression, no loss of interest in normal activity or change in sleep pattern.     Exam: chaperone present  BP 126/82  Ht 5\' 2"  (1.575 m)  Wt 191 lb (86.637 kg)  BMI 34.93 kg/m2  LMP 03/27/2014  Body mass index is 34.93 kg/(m^2).  General appearance : Well developed well nourished female. No acute distress HEENT: Neck supple, trachea midline, no carotid bruits, no thyroidmegaly Lungs: Clear to auscultation, no rhonchi or wheezes, or rib retractions  Heart: Regular rate and rhythm, no murmurs or gallops Breast:Examined in sitting and supine position were symmetrical in appearance, no palpable masses or tenderness,  no skin retraction, no nipple inversion, no nipple discharge, no skin discoloration, no axillary or supraclavicular lymphadenopathy Abdomen: no palpable masses or tenderness, no rebound or guarding Extremities: no edema or skin discoloration or tenderness  Pelvic:  Bartholin, Urethra, Skene Glands: Within normal limits             Vagina: No gross lesions or discharge  Cervix: No gross lesions or discharge  Uterus  anteverted, normal size, shape and consistency, non-tender and mobile  Adnexa  Without masses or tenderness  Anus and perineum  normal   Rectovaginal  normal sphincter tone without palpated masses or tenderness             Hemoccult not indicated     Assessment/Plan:  43 y.o. female for annual exam who is being followed by her endocrinologist and has been doing her blood work. She will need a bone density study next year. We discussed importance of calcium and vitamin D regular exercise for osteoporosis prevention. Patient has been taking the bromocriptine twice a week and causes  her to have nausea I told her that she can applied intravaginally. Her primary care physician and her blood work to include prolactin level and all were normal. She will talk with Dr. Lurene ShadowBallen to see if maybe she can come off the bromocriptine. Pap smear was not done today in accordance to the new guidelines. Patient was reminded to follow up with her breast ultrasound as recommended for the microcyst noted at time of her mammogram. Patient will continue on her oral contraceptive pill.  Note: This dictation was prepared with  Dragon/digital dictation along withSmart phrase technology. Any transcriptional errors that result from this process are unintentional.   Ok EdwardsJuan H Arville Postlewaite MD, 9:10 AM 04/12/2014

## 2014-06-27 ENCOUNTER — Telehealth: Payer: Self-pay

## 2014-06-27 NOTE — Telephone Encounter (Signed)
Clld pt  - LMOVMTC on cell to sched follow up Diabetes mgmt appt.

## 2014-08-13 ENCOUNTER — Other Ambulatory Visit: Payer: Self-pay | Admitting: Gynecology

## 2014-08-13 DIAGNOSIS — N6320 Unspecified lump in the left breast, unspecified quadrant: Secondary | ICD-10-CM

## 2014-08-24 ENCOUNTER — Telehealth: Payer: Self-pay

## 2014-08-24 NOTE — Telephone Encounter (Signed)
Clld pt - Advsd of need to schedule Diabetic Care Follow up appt. Pt advsd that her [facility] care [facility]. Pt was advsd that when she goes back in January 2016 to have office fax office notes and current lab work so that we can scanned documents into her chart. Pt stated she would.

## 2014-09-17 ENCOUNTER — Ambulatory Visit
Admission: RE | Admit: 2014-09-17 | Discharge: 2014-09-17 | Disposition: A | Discharge status: Home / Self Care | Payer: BC Managed Care – PPO | Source: Ambulatory Visit | Attending: Gynecology | Admitting: Gynecology

## 2014-09-17 DIAGNOSIS — N6320 Unspecified lump in the left breast, unspecified quadrant: Secondary | ICD-10-CM

## 2014-09-24 ENCOUNTER — Encounter: Payer: Self-pay | Admitting: Gynecology

## 2015-02-08 ENCOUNTER — Other Ambulatory Visit: Payer: Self-pay | Admitting: Gynecology

## 2015-02-08 DIAGNOSIS — N6009 Solitary cyst of unspecified breast: Secondary | ICD-10-CM

## 2015-03-07 ENCOUNTER — Ambulatory Visit
Admission: RE | Admit: 2015-03-07 | Discharge: 2015-03-07 | Disposition: A | Discharge status: Home / Self Care | Payer: BLUE CROSS/BLUE SHIELD | Source: Ambulatory Visit | Attending: Gynecology | Admitting: Gynecology

## 2015-03-07 DIAGNOSIS — N6009 Solitary cyst of unspecified breast: Secondary | ICD-10-CM

## 2015-04-23 ENCOUNTER — Encounter: Payer: Self-pay | Admitting: Gynecology

## 2015-08-08 ENCOUNTER — Other Ambulatory Visit: Payer: Self-pay | Admitting: Gynecology

## 2015-08-08 DIAGNOSIS — N6002 Solitary cyst of left breast: Secondary | ICD-10-CM

## 2015-08-13 ENCOUNTER — Encounter: Payer: Self-pay | Admitting: Gynecology

## 2015-08-13 ENCOUNTER — Ambulatory Visit (INDEPENDENT_AMBULATORY_CARE_PROVIDER_SITE_OTHER): Payer: BLUE CROSS/BLUE SHIELD | Admitting: Gynecology

## 2015-08-13 VITALS — BP 132/78 | Ht 63.0 in | Wt 189.0 lb

## 2015-08-13 DIAGNOSIS — Z01419 Encounter for gynecological examination (general) (routine) without abnormal findings: Secondary | ICD-10-CM

## 2015-08-13 DIAGNOSIS — E288 Other ovarian dysfunction: Secondary | ICD-10-CM

## 2015-08-13 DIAGNOSIS — Z23 Encounter for immunization: Secondary | ICD-10-CM

## 2015-08-13 DIAGNOSIS — E2839 Other primary ovarian failure: Secondary | ICD-10-CM

## 2015-08-13 DIAGNOSIS — M8580 Other specified disorders of bone density and structure, unspecified site: Secondary | ICD-10-CM | POA: Diagnosis not present

## 2015-08-13 NOTE — Addendum Note (Signed)
Addended by: Berna Spare A on: 08/13/2015 04:16 PM   Modules accepted: Orders, SmartSet

## 2015-08-13 NOTE — Patient Instructions (Signed)
Densitometra sea  (Bone Densitometry) La densitometra sea es una radiografa especial que mide la densidad de los huesos y se utiliza para predecir el riesgo de fracturas seas. Esta estudio se utiliza para determinar el contenido mineral y la densidad de los huesos para diagnosticar osteoporosis. La osteoporosis es la prdida de tejido seo que hace que el hueso se debilite. Generalmente ocurre en las mujeres que entran en la menopausia. Pero tambin pueden sufrirla los hombres y personas con otras enfermedades.  PREPARACIN PARA LA PRUEBA  No es necesaria la preparacin.  QUIENES DEBEN EXAMINARSE?   Todas las mujeres mayores de 65 aos.  Las mujeres posmenopusicas (50 a 65 aos) con factores de riesgo para osteoporosis.  Las personas que han sufrido fracturas previas realizando actividades normales.  Las personas de contextura corporal delgada (menos de 127 libras [63.5 kg] o con un ndice de masa corporal [IMC] de menos de 21).  Las personas que tengan un padre que haya sufrido una fractura de cadera o que tengan antecedentes de osteoporosis.  Los fumadores.  Las personas que sufren artritis reumatoidea.  Los que consumen alcohol en exceso (ms de 3 medidas la mayor parte de los das).  Las mujeres con menopausia temprana. CUNDO DEBE REALIZAR UN NUEVO ESTUDIO?  Las guas actuales sugieren que se debe esperar por lo menos 2 aos antes de repetir una prueba de densidad sea, si la primera fue normal. Algunos estudios recientes indican que las mujeres con densidad sea normal pueden esperar unos aos antes de repetir un estudio de densitometra sea. Comente estos temas con su mdico.  HALLAZGOS NORMALES:   Normal: menos de una desviacin estndar por debajo de lo normal (superior a -1).  Osteopenia:  1 a 2,5 desviaciones estndar por debajo de lo normal (-1 a -2,5).  Osteoporosis: ms de 2,5 desviaciones estndar por debajo de lo normal (menos de -2,5). Los resultados se  informan como una "puntuacin T" y una "puntuacin Z". La puntuacin T es el nmero que compara la densidad sea con la densidad sea de las mujeres jvenes y sanas. La puntuacin Z es un nmero que compara la densidad sea con las puntuaciones de mujeres de la misma edad, gnero y raza.  Los rangos para los resultados normales pueden variar entre diferentes laboratorios y hospitales. Consulte siempre con su mdico despus de hacer el estudio para comentar el significado de los resultados y si los valores se consideran "dentro de los lmites normales".  SIGNIFICADO DEL ESTUDIO  El mdico leer los resultados y comentar con usted la importancia y el significado de los resultados, as como las opciones de tratamiento y la necesidad de pruebas adicionales, si fuera necesario.  OBTENCIN DE LOS RESULTADOS DE LAS PRUEBAS  Es su responsabilidad retirar el resultado del estudio. Consulte en el laboratorio cuando y cmo podr obtener los resultados.  Document Released: 08/03/2012 [employer] Patient Information 2015 [employer]. This information is not intended to replace advice given to you by your health care provider. Make sure you discuss any questions you have with your health care provider.  

## 2015-08-13 NOTE — Progress Notes (Signed)
Karen Peterson [birth year] [id number]   History:    44 y.o.  for annual gyn exam with history of premature ovarian failure diagnosed in 2005. Her endocrinologist Dr. Lurene Shadow has been treating her for her type 2 diabetes as well as monitoring her prolactin level as a result of her hyperprolactinemia and also treating her for hypercholesterolemia. Patient has informing that her endocrinologist had stopped her oral contraception pill approximately 5 months ago which I had her on in the event that she would begin to ovulate spontaneously and couldn't conceive if not planning to do so at this age of 49. Also to help with her bone as well as vaginal dryness and any potential vasomotor symptoms. She is no longer on the Parlodel and is in the process of having all her labs rechecked in October. Patient with no past history of any abnormal Pap smears.  Patient's last bone density study 2013 demonstrated that her lowest T score was -2.0 at the AP spine  Patient currently taking her calcium and vitamin D. She had her mammogram this year and is in the process of having a follow-up ultrasound in October as a result of multicystic area at the 12:00 position of the left breast but no suspicious calcifications were reported.  Past medical history,surgical history, family history and social history were all reviewed and documented in the EPIC chart.  Gynecologic History Patient's last menstrual period was 12/12/2014. Contraception: none Last Pap: 2014. Results were: normal Last mammogram: See above. Results were: See above  Obstetric History OB History  Gravida Para Term Preterm AB SAB TAB Ectopic Multiple Living  # Outcome Date GA Lbr Len/2nd Weight Sex Delivery Anes PTL Lv  4 Term     F Vag-Spont  N Y  3 Term     F Vag-Spont  N Y  2 Term     M Vag-Spont  N Y  1 Term     F Vag-Spont  N Y       ROS: A ROS was performed and pertinent positives and negatives are included in the  history.  GENERAL: No fevers or chills. HEENT: No change in vision, no earache, sore throat or sinus congestion. NECK: No pain or stiffness. CARDIOVASCULAR: No chest pain or pressure. No palpitations. PULMONARY: No shortness of breath, cough or wheeze. GASTROINTESTINAL: No abdominal pain, nausea, vomiting or diarrhea, melena or bright red blood per rectum. GENITOURINARY: No urinary frequency, urgency, hesitancy or dysuria. MUSCULOSKELETAL: No joint or muscle pain, no back pain, no recent trauma. DERMATOLOGIC: No rash, no itching, no lesions. ENDOCRINE: No polyuria, polydipsia, no heat or cold intolerance. No recent change in weight. HEMATOLOGICAL: No anemia or easy bruising or bleeding. NEUROLOGIC: No headache, seizures, numbness, tingling or weakness. PSYCHIATRIC: No depression, no loss of interest in normal activity or change in sleep pattern.     Exam: chaperone present  BP 132/78 mmHg  Ht  (1.6 m)  Wt 189 lb (85.73 kg)  BMI 33.49 kg/m2  LMP 12/12/2014  Body mass index is 33.49 kg/(m^2).  General appearance : Well developed well nourished female. No acute distress HEENT: Eyes: no retinal hemorrhage or exudates,  Neck supple, trachea midline, no carotid bruits, no thyroidmegaly Lungs: Clear to auscultation, no rhonchi or wheezes, or rib retractions  Heart: Regular rate and rhythm, no murmurs or gallops Breast:Examined in sitting and supine position were symmetrical in appearance, no palpable masses or  tenderness,  no skin retraction, no nipple inversion, no nipple discharge, no skin discoloration, no axillary or supraclavicular lymphadenopathy Abdomen: no palpable masses or tenderness, no rebound or guarding Extremities: no edema or skin discoloration or tenderness  Pelvic:  Bartholin, Urethra, Skene Glands: Within normal limits             Vagina: No gross lesions or discharge  Cervix: No gross lesions or discharge  Uterus  anteverted, normal size, shape and consistency, non-tender  and mobile  Adnexa  Without masses or tenderness  Anus and perineum  normal   Rectovaginal  normal sphincter tone without palpated masses or tenderness             Hemoccult not indicated     Assessment/Plan:  44 y.o. female for annual exam with history of premature ovarian failure, type 2 diabetes, past history of hyperprolactinemia, history of hyperlipidemia. Patient's PCP has been doing her blood work. I'm going to recommend that after her blood work in October she returned back on a 20 g oral contraception pill not only for contraception but also for bone health and vasomotor symptoms. Pap smear not indicated this year. She is to schedule her bone density study for later this month. We discussed importance of calcium vitamin D and weightbearing exercises for osteoporosis prevention. Patient to receive the flu vaccine today.   Ok Edwards MD, 3:34 PM 08/13/2015

## 2015-08-22 ENCOUNTER — Other Ambulatory Visit: Payer: Self-pay | Admitting: Gynecology

## 2015-08-22 ENCOUNTER — Encounter: Payer: Self-pay | Admitting: Gynecology

## 2015-08-22 ENCOUNTER — Ambulatory Visit (INDEPENDENT_AMBULATORY_CARE_PROVIDER_SITE_OTHER): Payer: BLUE CROSS/BLUE SHIELD

## 2015-08-22 DIAGNOSIS — E2839 Other primary ovarian failure: Secondary | ICD-10-CM

## 2015-08-22 DIAGNOSIS — E288 Other ovarian dysfunction: Secondary | ICD-10-CM | POA: Diagnosis not present

## 2015-08-22 DIAGNOSIS — M810 Age-related osteoporosis without current pathological fracture: Secondary | ICD-10-CM

## 2015-08-26 ENCOUNTER — Other Ambulatory Visit: Payer: Self-pay | Admitting: *Deleted

## 2015-08-26 DIAGNOSIS — M810 Age-related osteoporosis without current pathological fracture: Secondary | ICD-10-CM

## 2015-08-27 ENCOUNTER — Other Ambulatory Visit: Payer: Self-pay | Admitting: Gynecology

## 2015-08-27 ENCOUNTER — Other Ambulatory Visit: Payer: BLUE CROSS/BLUE SHIELD

## 2015-08-27 DIAGNOSIS — M810 Age-related osteoporosis without current pathological fracture: Secondary | ICD-10-CM

## 2015-08-27 LAB — COMPREHENSIVE METABOLIC PANEL
ALBUMIN: 4.3 g/dL (ref 3.6–5.1)
ALT: 17 U/L (ref 6–29)
AST: 17 U/L (ref 10–30)
Alkaline Phosphatase: 69 U/L (ref 33–115)
BILIRUBIN TOTAL: 0.3 mg/dL (ref 0.2–1.2)
BUN: 12 mg/dL (ref 7–25)
CHLORIDE: 103 mmol/L (ref 98–110)
CO2: 28 mmol/L (ref 20–31)
CREATININE: 0.79 mg/dL (ref 0.50–1.10)
Calcium: 9.6 mg/dL (ref 8.6–10.2)
Glucose, Bld: 88 mg/dL (ref 65–99)
Potassium: 4.1 mmol/L (ref 3.5–5.3)
SODIUM: 141 mmol/L (ref 135–146)
TOTAL PROTEIN: 7.6 g/dL (ref 6.1–8.1)

## 2015-08-28 LAB — VITAMIN D 25 HYDROXY (VIT D DEFICIENCY, FRACTURES): Vit D, 25-Hydroxy: 29 ng/mL — ABNORMAL LOW (ref 30–100)

## 2015-08-28 LAB — PARATHYROID HORMONE, INTACT (NO CA): PTH: 28 pg/mL (ref 14–64)

## 2015-09-02 ENCOUNTER — Other Ambulatory Visit: Payer: BLUE CROSS/BLUE SHIELD

## 2015-09-02 DIAGNOSIS — M810 Age-related osteoporosis without current pathological fracture: Secondary | ICD-10-CM

## 2015-09-02 LAB — CREATININE CLEARANCE, URINE, 24 HOUR
CREAT CLEAR: 234 mL/min — AB (ref 75–115)
CREATININE: 0.57 mg/dL (ref 0.50–1.10)
Creatinine, 24H Ur: 1923 mg/d — ABNORMAL HIGH (ref 700–1800)
Creatinine, Urine: 64.1 mg/dL

## 2015-09-05 ENCOUNTER — Other Ambulatory Visit: Payer: Self-pay | Admitting: Gynecology

## 2015-09-05 ENCOUNTER — Other Ambulatory Visit: Payer: Self-pay

## 2015-09-05 DIAGNOSIS — N6002 Solitary cyst of left breast: Secondary | ICD-10-CM

## 2015-09-09 ENCOUNTER — Ambulatory Visit
Admission: RE | Admit: 2015-09-09 | Discharge: 2015-09-09 | Disposition: A | Discharge status: Home / Self Care | Payer: BLUE CROSS/BLUE SHIELD | Source: Ambulatory Visit | Attending: Gynecology | Admitting: Gynecology

## 2015-09-09 DIAGNOSIS — N6002 Solitary cyst of left breast: Secondary | ICD-10-CM

## 2015-09-30 ENCOUNTER — Encounter: Payer: Self-pay | Admitting: Gynecology

## 2015-09-30 ENCOUNTER — Ambulatory Visit (INDEPENDENT_AMBULATORY_CARE_PROVIDER_SITE_OTHER): Payer: BLUE CROSS/BLUE SHIELD | Admitting: Gynecology

## 2015-09-30 VITALS — BP 140/90

## 2015-09-30 DIAGNOSIS — E288 Other ovarian dysfunction: Secondary | ICD-10-CM

## 2015-09-30 DIAGNOSIS — E2839 Other primary ovarian failure: Secondary | ICD-10-CM

## 2015-09-30 DIAGNOSIS — M810 Age-related osteoporosis without current pathological fracture: Secondary | ICD-10-CM

## 2015-09-30 DIAGNOSIS — Z8639 Personal history of other endocrine, nutritional and metabolic disease: Secondary | ICD-10-CM

## 2015-09-30 NOTE — Progress Notes (Signed)
   Patient is a 44 year old who according to the records indicated that she was diagnosed with premature ovarian failure in 2008. Since that time she had been on a 20 g oral contraceptive pill Junel 1/20 and was having cycles regularly although light. Patient also in the past been diagnosed with hyperprolactinemia and had been on Parlodel for which she is no longer on it. Her endocrinologist Karen Peterson has been treating her for hypercholesterolemia and type 2 diabetes. Patient stated that she had a normal TSH and prolactin 6 months ago and that her endocrinologist discontinue her oral contraceptive pill and was cut and retest her FSH and possibly her prolactin level later this week. These past 6 months and she's been off the oral contraceptive pill has had no vaginal bleeding and no vasomotor symptoms. At time of her annual exam here in the office on 08/13/2015 a vitamin D level along with calcium and PTH were obtained with the following results noted:  PTH 28 (normal) Serum calcium 9.6 (normal) Vitamin D 29 (slightly below normal)  24 hour urine creatinine clearance: Results for Karen Peterson, Karen Peterson (MRN [id number]) as of 09/30/2015 16:06  Ref. Range 09/02/2015 08:33  Creatinine, Urine Latest Units: mg/dL 78.264.1  Creatinine, 95A24H Ur Latest Ref Range: 601-423-8953 mg/day 1923 (H)  Creatinine Clearance Latest Ref Range: 75-115 mL/min 234 (H)   It appears that a calcium 24-hour urine collection was not run?  Patient had a bone density study done here in our office on September 29 with the following noted: The lowest T score was at the AP spine with a value of -2.5 (Z score -2.1). Since patient has been menopausal secondary to premature ovarian failure she would be considered osteoporotic. When 2016 was compared with 2013 bone density study there was a statistically significant decrease in the bone mineralization of the AP spine of -6.6% and the left femoral neck -4.8% and no statistically significant change in the  right femoral neck.  Assessment/plan: #1 premature ovarian failure #2 type 2 diabetes #3 past history of hyperprolactinemia #4 osteoporosis #5 vitamin D deficiency  Patient was instructed to begin taking vitamin D3 2000 units daily and will recheck her vitamin D level in 6 months. I will place a call to Karen Peterson as to patient's results [facility] that is going to be done later this week as well as her prolactin level. I have provided patient with literature information on Fosamax and after concurring with endocrinologist we may begin to start patient on this medication. The risks benefits and pros and cons were discussed to include osteonecrosis of the jaw and spontaneous subtrochanteric fracture. We'll also discuss whether instituting once again a 20 g oral contraceptive pill in the event that she were to begin ovulating spontaneously and we'll protect her from unwanted pregnancy as well as to help with her bones and potential vasomotor symptoms in the next few years. Or we could possibly start her on both the antiresorptive agent as well as oral contraceptive pill. We will wait and discuss with Karen Peterson later this week and contact patient with recommendations.  Greater than 50% of time was spent in counseling and coordinating care for this patient. Time of consultation 20 minutes

## 2015-09-30 NOTE — Patient Instructions (Addendum)
Alendronate tablets Qu es este medicamento? El ALENDRONATO reduce la prdida de calcio de los Prestonhuesos. Este medicamento se utiliza para ayudar aumentar la produccin de hueso sano y prevenir la prdida de hueso en personas con la enfermedad de [place] y osteoporosis. Tambin se utiliza en Qwest Communicationsaqullos que corren el riesgo de padecer prdida sea. Este medicamento puede ser utilizado para otros usos; si tiene alguna pregunta consulte con su proveedor de atencin mdica o con su farmacutico. Qu le debo informar a mi profesional de la salud antes de tomar este medicamento? Necesita saber si usted presenta alguno de los siguientes problemas o situaciones: -enfermedad dental -problemas estomacales, intestinales o esofgicos, tales como reflujo cido o ERGE -enfermedad renal -bajo nivel de calcio en la sangre -bajo nivel de vitamina D -problemas de estar sentado o de pie durante 30 minutos -problemas al tragar -Neomia Dearuna reaccin alrgica o inusual al alendronato, a otros medicamentos, alimentos, colorantes o conservantes -si est embarazada o buscando quedar embarazada -si est amamantando a un beb Cmo debo utilizar este medicamento? Debe tomar este medicamento exactamente como lo indicado, si no reduciri la cantidad de medicamento que su cuerpo absorber o le puede causar dao a usted mismo. L-3 Communicationsome este medicamento por va oral por la maana, despus de levantarse. No beba ni coma nada antes de tomar su medicamento. Tome las tabletas con un vaso lleno (180 a 240 ml, es decir 6 a 8 onzas) de [employer]ck & Coagua pura. No tome este medicamento con ningn otro tipo de lquido. No mastique ni triture las tabletas. No desayune, beba ni tome ningn otro medicamento o vitaminas durante por lo menos 30 minutos despus de tomar PPL Corporationeste medicamento. Despus de tomar el medicamento, permanezca sentado o de pie durante por lo menos 30 minutos; no se acueste. No tome su medicamento con una frecuencia mayor a la indicada. Hable con su  pediatra para informarse acerca del uso de este medicamento en nios. Puede requerir atencin especial. Sobredosis: Pngase en contacto inmediatamente con un centro toxicolgico o una sala de urgencia si usted cree que haya tomado demasiado medicamento. ATENCIN: Reynolds AmericanEste medicamento es solo para usted. No comparta este medicamento con nadie. Qu sucede si me olvido de una dosis? Si olvida una dosis, no la tome ms tarde Dana Corporationese mismo da. Contine con su horario normal empezando la maana siguiente. No tome dosis adicionales o dobles. Qu puede interactuar con este medicamento? -hidrxido de aluminio -anticidos -aspirina -suplementos de calcio -medicamentos para la inflamacin, tales como ibuprofeno, naproxeno, entre otros -suplementos con hierro -suplementos de magnesio -vitaminas con minerales Puede ser que esta lista no menciona todas las posibles interacciones. Informe a su profesional de Beazer Homesla salud de [employer] Inctodos los productos a base de hierbas, medicamentos de [place] libre o suplementos nutritivos que est tomando. Si usted fuma, consume bebidas alcohlicas o si utiliza drogas ilegales, indqueselo tambin a su profesional de Beazer Homesla salud. Algunas sustancias pueden interactuar con su medicamento. A qu debo estar atento al usar PPL Corporationeste medicamento? Visite a su mdico o a su profesional de la salud para chequeos peridicos. Puede ser necesario que transcurra cierto tiempo antes de que pueda observar los beneficios de Enterpriseeste medicamento. No deje de tomar su medicamento excepto si as lo indica su mdico. Su mdico o su profesional de la salud puede pedirle anlisis de sangre u otros exmenes para International aid/development workerchequear su evolucin. Asegurarse de que su dieta incluya la cantidad necesaria de calcio y vitamina D mientras est tomando este medicamento, a menos que su mdico le indica lo contrario. Hable  con su profesional de la salud acerca de sus alimentos y las vitaminas que est tomando. Algunas personas que toman este  medicamento experimentan dolor grave de [place], de articulaciones o/y de msculos. Este medicamento tambin puede aumentar el riesgo de un fmur roto. Informe a su mdico inmediatamente si tiene dolor en la pierna superior o la ingle. Si experimenta dolor que no desaparece o que empeora, informe a su mdico. Este medicamento puede aumentar la sensibilidad al sol. Si desarrolla un sarpullido mientras est tomando PPL Corporation, el sarpullido puede empeorar con la exposicin al sol. Mantngase fuera de Secretary/administrator. Si no lo puede evitar, utilice ropa protectora y crema de Orthoptist. No utilice lmparas solares, camas solares ni cabinas solares. Qu efectos secundarios puedo tener al [employer] este medicamento? Efectos secundarios que debe informar a su mdico o a Producer, television/film/video de la salud tan pronto como sea posible: -Therapist, art como erupcin cutnea, picazn o urticarias, hinchazn de la cara, labios o lengua -heces de color oscuro o con aspecto alquitranado -dolores seos, musculares o articulares -cambios en la visin -dolor en el pecho -dolor o acidez de estmago -dolor de mandbula, especialmente despus de tratamiento dental -dolor o dificultad para tragar -enrojecimiento, formacin de ampollas, descamacin o distensin de la piel, inclusive dentro de la boca Efectos secundarios que, por lo general, no requieren atencin mdica (debe informarlos a su mdico o a su profesional de la salud si persisten o si son molestos): -cambios en el sentido del gusto -diarrea o estreimiento -dolor ocular o picazn -dolor de cabeza -nuseas, vmito -sensacin de saciedad o gases en el estmago Puede ser que esta lista no menciona todos los posibles efectos secundarios. Comunquese a su mdico por asesoramiento mdico [employer]. Usted puede informar los efectos secundarios a la FDA por telfono al 1-800-FDA-1088. Dnde debo guardar mi medicina? Mantngala fuera del  alcance de los nios. Gurdela a temperatura ambiente de entre 15 y 30 grados C (41 y 23 grados F). Deseche todo el medicamento que no haya utilizado, despus de la fecha de vencimiento. ATENCIN: Este folleto es un resumen. Puede ser que no cubra toda la posible informacin. Si usted tiene preguntas acerca de esta medicina, consulte con su mdico, su farmacutico o su profesional de Radiographer, therapeutic.    2016, Elsevier/Gold Standard. (2015-01-01 00:00:00)

## 2015-10-03 ENCOUNTER — Telehealth: Payer: Self-pay | Admitting: Gynecology

## 2015-10-03 ENCOUNTER — Telehealth: Payer: Self-pay | Admitting: *Deleted

## 2015-10-03 MED ORDER — ALENDRONATE SODIUM 70 MG PO TABS
70.0000 mg | ORAL_TABLET | ORAL | Status: DC
Start: 2015-10-03 — End: 2017-12-23

## 2015-10-03 MED ORDER — NORETHINDRONE ACET-ETHINYL EST 1-20 MG-MCG PO TABS: 1.0000 | ORAL_TABLET | Freq: Every day | ORAL | Status: DC

## 2015-10-03 NOTE — Telephone Encounter (Signed)
Telephone commerce station with Dr. Lurene ShadowBallen endocrinologist who is currently treating this patient for hypercholesterolemia and type 2 diabetes. Patient seen the office for annual exam on November 7. Patient had been taken off the oral contraceptive pill over the past 6 months that she has not had a menstrual cycle. Patient with known history of premature ovarian failure which was diagnosed back in 2008 and for this reason patient had been on a 20 g oral contraceptive pill. Dr. Lurene ShadowBallen has informed me that the patient was tested in the office recently and her prolactin level is normal she she's been off of the bromocriptine. She agrees with me to start the patient back on the oral contraceptive pill for the reason I had given the patient for contraception in the event that in the future she would begin to ovulate spontaneously as well as for bone health and for possible menopausal symptoms. Also she had a bone density study that demonstrated the following:  The lowest T score was at the AP spine with a value of -2.5 (Z score -2.1). Since patient has been menopausal secondary to premature ovarian failure she would be considered osteoporotic. When 2016 was compared with 2013 bone density study there was a statistically significant decrease in the bone mineralization of the AP spine of -6.6% and the left femoral neck -4.8% and no statistically significant change in the right femoral neck.   We'll notify the patient that she does not need to continue taking her bromocriptine. We will start on the Fosamax 70 mg every weekly as a result of her osteoporosis for which she was counseled on last office visit. She will also be started back on her 20 g oral contraceptive pill. She will be encouraged also to take her vitamin D 2000 units daily and recheck her vitamin D level in 6 months since her recent vitamin D level was 29.

## 2015-10-03 NOTE — Telephone Encounter (Signed)
-----   Message from Ok EdwardsJuan H Fernandez, MD sent at 10/03/2015  9:55 AM EST -----  Victorino DikeJennifer, please call patient and tell her that I spoke with her endocrinologist Dr. Lurene ShadowBallen and that she agrees with me starting her back on her oral contraceptive pill which Would like her to begin taking daily. Also if she did not receive a prescription please call in for the Fosamax 70 mg her to take one by mouth every weekly for her osteoporosis. Reminder also the vitamin D 2000 units daily and to return back in 6 months to check her vitamin D level.

## 2015-10-03 NOTE — Telephone Encounter (Signed)
Pt aware Rx sent, JF said pt can have Junel 1/20 birth control pills

## 2015-10-07 ENCOUNTER — Encounter: Payer: Self-pay | Admitting: Gynecology

## 2016-02-13 ENCOUNTER — Other Ambulatory Visit: Payer: Self-pay | Admitting: Gynecology

## 2016-02-13 DIAGNOSIS — N6002 Solitary cyst of left breast: Secondary | ICD-10-CM

## 2016-02-21 ENCOUNTER — Other Ambulatory Visit: Payer: Self-pay | Admitting: Gynecology

## 2016-02-21 ENCOUNTER — Ambulatory Visit
Admission: RE | Admit: 2016-02-21 | Discharge: 2016-02-21 | Disposition: A | Discharge status: Home / Self Care | Payer: BLUE CROSS/BLUE SHIELD | Source: Ambulatory Visit | Attending: Gynecology | Admitting: Gynecology

## 2016-02-21 DIAGNOSIS — N6002 Solitary cyst of left breast: Secondary | ICD-10-CM

## 2016-03-13 ENCOUNTER — Ambulatory Visit
Admission: RE | Admit: 2016-03-13 | Discharge: 2016-03-13 | Disposition: A | Discharge status: Home / Self Care | Payer: BLUE CROSS/BLUE SHIELD | Source: Ambulatory Visit | Attending: Gynecology | Admitting: Gynecology

## 2016-03-13 DIAGNOSIS — N6002 Solitary cyst of left breast: Secondary | ICD-10-CM

## 2016-07-20 ENCOUNTER — Ambulatory Visit (INDEPENDENT_AMBULATORY_CARE_PROVIDER_SITE_OTHER): Payer: BLUE CROSS/BLUE SHIELD | Admitting: Emergency Medicine

## 2016-07-20 ENCOUNTER — Encounter: Payer: Self-pay | Admitting: Emergency Medicine

## 2016-07-20 VITALS — BP 122/72 | HR 99 | Temp 98.6°F | Resp 17 | Ht 62.5 in | Wt 183.0 lb

## 2016-07-20 DIAGNOSIS — R300 Dysuria: Secondary | ICD-10-CM | POA: Insufficient documentation

## 2016-07-20 LAB — POC MICROSCOPIC URINALYSIS (UMFC): MUCUS RE: ABSENT

## 2016-07-20 LAB — POCT URINALYSIS DIP (MANUAL ENTRY)
BILIRUBIN UA: NEGATIVE
BILIRUBIN UA: NEGATIVE
Glucose, UA: NEGATIVE
Leukocytes, UA: NEGATIVE
Nitrite, UA: NEGATIVE
PH UA: 5.5
PROTEIN UA: NEGATIVE
SPEC GRAV UA: 1.025
Urobilinogen, UA: 0.2

## 2016-07-20 LAB — POCT WET + KOH PREP
Trich by wet prep: ABSENT
Yeast by KOH: ABSENT
Yeast by wet prep: ABSENT

## 2016-07-20 NOTE — Progress Notes (Signed)
   Subjective:    Patient ID: Karen Peterson, female    DOB: 1971/09/27, 45 y.o.   MRN: [id number]  Chief Complaint  Patient presents with  . Dysuria    PCP: Nelva NayMarte, Heather M, PA-C  HPI  This is a 45 y.o. female who is presenting with dysuria. This has been ongoing for 5 days.  Denies any prior history of urinary infection before. No fevers or chills. Having some itchiness and dryness and no discharge. Sexually active in a monogamous relationships. No back pain, constipation, diarrhea, vomiting or nausea, or rashes. Has had four vaginal deliveries. She has had bacterial vaginosis and it doesn't feel like that. 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   Review of Systems  ROS: No unexpected weight loss, fever, chills, swelling, instability, muscle pain, numbness/tingling, redness, otherwise see HPI    PMH: Hyperprolactinemia, Type 2 DM, hypercholesterolemia  PShx: none  PSx: no tobacco or alcohol     Objective:   Physical Exam BP 122/72 (BP Location: Right Arm, Patient Position: Sitting, Cuff Size: Normal)   Pulse 99   Temp 98.6 F (37 C) (Oral)   Resp 17   Ht 5' 2.5" (1.588 m)   Wt 183 lb (83 kg)   SpO2 98%   BMI 32.94 kg/m  Gen: NAD, alert, cooperative with exam,  HEENT: clear conjunctiva,  CV: RRR, good S1/S2, no murmur, no edema, capillary refill brisk  Resp: CTABL, no wheezes, non-labored Abd: SNTND, BS present, no guarding or organomegaly, no suprapubic pain  MSK: no CVA tenderness  Skin: no rashes, normal turgor  Neuro: no gross deficits.  Psych: alert and oriented      Assessment & Plan:   Dysuria UA is not suggestive of infection. Has completed a four month course of ABX. Wet prep is normal as well.  - sent urine culture  - work note provided for today  - if urine culture is normal then she may want to follow up with her gynecologist.

## 2016-07-20 NOTE — Patient Instructions (Addendum)
Thank you for coming in,   We will call you with the results of the urine culture.   You can take over the counter azo for pain.   If your urine culture is negative then you might want to make an appointment with your gynecologist.    Please feel free to call with any questions or concerns at any time, at [phone]. --Dr. Jordan LikesSchmitz     IF you received an x-ray today, you will receive an invoice from [facility]. Please contact [facility] at [phone] with questions or concerns regarding your invoice.   IF you received labwork today, you will receive an invoice from [facility]. Please contact [facility] at [phone] with questions or concerns regarding your invoice.   Our billing staff will not be able to assist you with questions regarding bills from these companies.  You will be contacted with the lab results as soon as they are available. The fastest way to get your results is to activate your My Chart account. Instructions are located on the last page of this paperwork. If you have not heard from us regarding the results in 2 weeks, please contact this office.

## 2016-07-20 NOTE — Assessment & Plan Note (Signed)
UA is not suggestive of infection. Has completed a four month course of ABX. Wet prep is normal as well.  - sent urine culture  - work note provided for today  - if urine culture is normal then she may want to follow up with her gynecologist.

## 2016-07-21 LAB — URINE CULTURE: ORGANISM ID, BACTERIA: NO GROWTH

## 2016-09-28 ENCOUNTER — Ambulatory Visit (INDEPENDENT_AMBULATORY_CARE_PROVIDER_SITE_OTHER): Payer: BLUE CROSS/BLUE SHIELD | Admitting: Gynecology

## 2016-09-28 ENCOUNTER — Encounter: Payer: Self-pay | Admitting: Gynecology

## 2016-09-28 VITALS — BP 126/80 | Ht 62.0 in | Wt 182.0 lb

## 2016-09-28 DIAGNOSIS — E2839 Other primary ovarian failure: Secondary | ICD-10-CM | POA: Insufficient documentation

## 2016-09-28 DIAGNOSIS — Z01411 Encounter for gynecological examination (general) (routine) with abnormal findings: Secondary | ICD-10-CM

## 2016-09-28 DIAGNOSIS — M818 Other osteoporosis without current pathological fracture: Secondary | ICD-10-CM | POA: Diagnosis not present

## 2016-09-28 DIAGNOSIS — E288 Other ovarian dysfunction: Secondary | ICD-10-CM

## 2016-09-28 MED ORDER — NORETHINDRONE ACET-ETHINYL EST 1-20 MG-MCG PO TABS: 1.0000 | ORAL_TABLET | Freq: Every day | ORAL | 11 refills | Status: DC

## 2016-09-28 NOTE — Addendum Note (Signed)
Addended by: Kem ParkinsonBARNES, Rhilee Currin on: 09/28/2016 04:26 PM   Modules accepted: Orders

## 2016-09-28 NOTE — Progress Notes (Signed)
Karen Peterson [birth year] [id number]   History:    45 y.o.  for annual gyn exam with no complaints today. Patient with known history of premature ovarian failure diagnosed in 2005. Her endocrinologist Dr. Lurene ShadowBallen has been treating her for her type 2 diabetes as well as monitoring her prolactin level as a result of her hyperprolactinemia and also treating her for hypercholesterolemia. Patient no longer on Parlodel. Patient's bone density study 08/22/2015 demonstrated the following: The lowest T score was at the AP spine with a value of -2.5 (Z score -2.1). Since patient has been menopausal secondary to premature ovarian failure she would be considered osteoporotic. When 2016 was compared with 2013 bone density study there was a statistically significant decrease in the bone mineralization of the AP spine of -6.6% and the left femoral neck -4.8% and no statistically significant change in the right femoral neck. She is currently on Fosamax 70 mg every weekly. She has history in the past vitamin D deficiency currently on vitamin D3 2000 units daily. Her endocrinologist and doing all her blood work including her vitamin D levels.  Patient with no past history of any abnormal Pap smears. Patient has informed me that this year she had been on birth for 3 weeks and developed tuberculosis and was treated for 4 months and had a negative chest x-ray. Patient asymptomatic today.    Past medical history,surgical history, family history and social history were all reviewed and documented in the EPIC chart.  Gynecologic History Patient's last menstrual period was 09/15/2016 (lmp unknown). Contraception: OCP (estrogen/progesterone) Last Pap: 2014 . Results were: normal Last mammogram: 2017 . Results were: normal  Obstetric History OB History  Gravida Para Term Preterm AB Living  4 4 4     4   SAB TAB Ectopic Multiple Live Births          4    # Outcome Date GA Lbr Len/2nd Weight Sex Delivery Anes PTL Lv    4 Term     F Vag-Spont  N LIV  3 Term     F Vag-Spont  N LIV  2 Term     M Vag-Spont  N LIV  1 Term     F Vag-Spont  N LIV       ROS: A ROS was performed and pertinent positives and negatives are included in the history.  GENERAL: No fevers or chills. HEENT: No change in vision, no earache, sore throat or sinus congestion. NECK: No pain or stiffness. CARDIOVASCULAR: No chest pain or pressure. No palpitations. PULMONARY: No shortness of breath, cough or wheeze. GASTROINTESTINAL: No abdominal pain, nausea, vomiting or diarrhea, melena or bright red blood per rectum. GENITOURINARY: No urinary frequency, urgency, hesitancy or dysuria. MUSCULOSKELETAL: No joint or muscle pain, no back pain, no recent trauma. DERMATOLOGIC: No rash, no itching, no lesions. ENDOCRINE: No polyuria, polydipsia, no heat or cold intolerance. No recent change in weight. HEMATOLOGICAL: No anemia or easy bruising or bleeding. NEUROLOGIC: No headache, seizures, numbness, tingling or weakness. PSYCHIATRIC: No depression, no loss of interest in normal activity or change in sleep pattern.     Exam: chaperone present  BP 126/80   Ht 5\' 2"  (1.575 m)   Wt 182 lb (82.6 kg)   LMP 09/15/2016 (LMP Unknown)   BMI 33.29 kg/m   Body mass index is 33.29 kg/m.  General appearance : Well developed well nourished female. No acute distress HEENT: Eyes: no retinal hemorrhage or exudates,  Neck supple, trachea midline,  no carotid bruits, no thyroidmegaly Lungs: Clear to auscultation, no rhonchi or wheezes, or rib retractions  Heart: Regular rate and rhythm, no murmurs or gallops Breast:Examined in sitting and supine position were symmetrical in appearance, no palpable masses or tenderness,  no skin retraction, no nipple inversion, no nipple discharge, no skin discoloration, no axillary or supraclavicular lymphadenopathy Abdomen: no palpable masses or tenderness, no rebound or guarding Extremities: no edema or skin discoloration or  tenderness  Pelvic:  Bartholin, Urethra, Skene Glands: Within normal limits             Vagina: No gross lesions or discharge  Cervix: No gross lesions or discharge  Uterus  anteverted , normal size, shape and consistency, non-tender and mobile  Adnexa  Without masses or tenderness  Anus and perineum  normal   Rectovaginal  normal sphincter tone without palpated masses or tenderness             Hemoccult not indicated    Assessment/Plan:  45 y.o. female for annual exam with history of premature ovarian failure on oral contraceptive pill patient with past history of hyperprolactinemia was on Parlodel no longer on it PCP monitoring her prolactin level. Patient will need to have her bone density study next year. Her endocrinologist also has treating her for type 2 diabetes as well as for hypercholesterolemia. Pap smear was done today. She was reminded to do her monthly breast exams. We discussed importance of calcium and vitamin D and weightbearing exercises. Patient's flu vaccine is up-to-date.   Ok EdwardsFERNANDEZ,Dalessandro Baldyga H MD, 3:38 PM 09/28/2016

## 2016-10-02 LAB — PAP IG W/ RFLX HPV ASCU

## 2016-10-05 LAB — HUMAN PAPILLOMAVIRUS, HIGH RISK: HPV DNA HIGH RISK: NOT DETECTED

## 2016-11-10 ENCOUNTER — Ambulatory Visit (INDEPENDENT_AMBULATORY_CARE_PROVIDER_SITE_OTHER): Payer: BLUE CROSS/BLUE SHIELD | Admitting: Physician Assistant

## 2016-11-10 VITALS — BP 124/82 | HR 101 | Temp 98.3°F | Resp 18 | Ht 62.0 in | Wt 178.0 lb

## 2016-11-10 DIAGNOSIS — N390 Urinary tract infection, site not specified: Secondary | ICD-10-CM

## 2016-11-10 DIAGNOSIS — R350 Frequency of micturition: Secondary | ICD-10-CM | POA: Diagnosis not present

## 2016-11-10 LAB — POCT URINALYSIS DIP (MANUAL ENTRY)
BILIRUBIN UA: NEGATIVE
Bilirubin, UA: NEGATIVE
GLUCOSE UA: NEGATIVE
Nitrite, UA: NEGATIVE
SPEC GRAV UA: 1.02
Urobilinogen, UA: 0.2
pH, UA: 6

## 2016-11-10 LAB — POCT CBC
GRANULOCYTE PERCENT: 57.4 % (ref 37–80)
HEMATOCRIT: 37.2 % — AB (ref 37.7–47.9)
Hemoglobin: 12.8 g/dL (ref 12.2–16.2)
Lymph, poc: 3.1 (ref 0.6–3.4)
MCH, POC: 28.6 pg (ref 27–31.2)
MCHC: 34.5 g/dL (ref 31.8–35.4)
MCV: 82.9 fL (ref 80–97)
MID (CBC): 0.6 (ref 0–0.9)
MPV: 8.5 fL (ref 0–99.8)
POC GRANULOCYTE: 4.9 (ref 2–6.9)
POC LYMPH %: 35.9 % (ref 10–50)
POC MID %: 6.7 % (ref 0–12)
Platelet Count, POC: 206 10*3/uL (ref 142–424)
RBC: 4.48 M/uL (ref 4.04–5.48)
RDW, POC: 13.7 %
WBC: 8.6 10*3/uL (ref 4.6–10.2)

## 2016-11-10 LAB — POC MICROSCOPIC URINALYSIS (UMFC): MUCUS RE: ABSENT

## 2016-11-10 MED ORDER — CEFTRIAXONE SODIUM 1 G IJ SOLR
1.0000 g | Freq: Once | INTRAMUSCULAR | Status: AC
  Administered 2016-11-10: 1 g via INTRAMUSCULAR

## 2016-11-10 MED ORDER — CEPHALEXIN 500 MG PO CAPS: 500.0000 mg | ORAL_CAPSULE | Freq: Three times a day (TID) | ORAL | 0 refills | Status: AC

## 2016-11-10 NOTE — Progress Notes (Signed)
Karen Peterson  MRN: [id number] DOB: Apr 16, 1971  Subjective:  Karen Peterson is a 45 y.o. female seen in office today for a chief complaint of intermittent right side flank pain x 2 days that radiates to her lower abdomen. Has associated urinary urgency and urinary frequency. Denies acute injury fever, chills, nausea, vomiting, and hematuria. Has tried tylenol with mild relief of the pain. Today she feels as if the pain is worse. She has never had kidney stones. Does have history of UTI. She drinks about three-four bottles of water a day. LMP 10/19/2016.   Review of Systems  Respiratory: Negative for cough and shortness of breath.   Cardiovascular: Negative for chest pain and palpitations.  Genitourinary: Negative for menstrual problem, vaginal bleeding, vaginal discharge and vaginal pain.  Neurological: Negative for dizziness and headaches.    Patient Active Problem List   Diagnosis Date Noted  . Premature ovarian failure 09/28/2016  . Dysuria 07/20/2016  . Low bone mass 08/13/2015  . Hyperprolactinemia ([place]) 04/10/2013  . Type II diabetes mellitus ([place]) 03/24/2012  . Hypercholesterolemia 03/24/2012  . Premature menopause 03/24/2012  . Insomnia     Current Outpatient Prescriptions on File Prior to Visit  Medication Sig Dispense Refill  . alendronate (FOSAMAX) 70 MG tablet Take 1 tablet (70 mg total) by mouth every 7 (seven) days. Take with a full glass of water on an empty stomach. 4 tablet 11  . eszopiclone (LUNESTA) 1 MG TABS tablet Take 1 tablet (1 mg total) by mouth at bedtime as needed for sleep. Take immediately before bedtime 30 tablet 2  . fluticasone (FLONASE) 50 MCG/ACT nasal spray Place 2 sprays into the nose daily. 16 g 5  . glimepiride (AMARYL) 2 MG tablet Take 2 mg by mouth daily with breakfast.    . metFORMIN (GLUCOPHAGE) 500 MG tablet Take by mouth 2 (two) times daily with a meal.    . norethindrone-ethinyl estradiol (MICROGESTIN,JUNEL,LOESTRIN) 1-20 MG-MCG tablet  Take 1 tablet by mouth daily. 1 Package 11  . simvastatin (ZOCOR) 80 MG tablet Take 80 mg by mouth daily.     No current facility-administered medications on file prior to visit.     No Known Allergies      Past Surgical History:  Procedure Laterality Date  . CHOLECYSTECTOMY    . TUBAL LIGATION     PPTL   Social History   Social History  . Marital status: Single    Spouse name: N/A  . Number of children: N/A  . Years of education: N/A   Occupational History  . Not on file.   Social History Main Topics  . Smoking status: Never Smoker  . Smokeless tobacco: Never Used  . Alcohol use No  . Drug use: No  . Sexual activity: Yes    Birth control/ protection: None   Other Topics Concern  . Not on file   Social History Narrative  . No narrative on file    Objective:  BP 124/82 (BP Location: Right Arm, Patient Position: Sitting, Cuff Size: Small)   Pulse (!) 101   Temp 98.3 F (36.8 C) (Oral)   Resp 18   Ht 5' 2"  (1.575 m)   Wt 178 lb (80.7 kg)   LMP 10/19/2016   SpO2 98%   BMI 32.56 kg/m   Physical Exam  Constitutional: She is oriented to person, place, and time and well-developed, well-nourished, and in no distress.  HENT:  Head: Normocephalic and atraumatic.  Eyes: Conjunctivae are normal.  Neck:  Normal range of motion.  Cardiovascular: Regular rhythm and normal heart sounds.  Tachycardia present.   Pulmonary/Chest: Effort normal.  Abdominal: Soft. Normal appearance and bowel sounds are normal. There is tenderness in the right upper quadrant. There is CVA tenderness (right sided). There is no tenderness at McBurney's point and negative Murphy's sign.  Neurological: She is alert and oriented to person, place, and time. Gait normal.  Skin: Skin is warm and dry.  Psychiatric: Affect normal.  Vitals reviewed.    Results for orders placed or performed in visit on 11/10/16 (from the past 24 hour(s))  POCT urinalysis dipstick     Status: Abnormal    Collection Time: 11/10/16  2:33 PM  Result Value Ref Range   Color, UA yellow yellow   Clarity, UA clear clear   Glucose, UA negative negative   Bilirubin, UA negative negative   Ketones, POC UA negative negative   Spec Grav, UA 1.020    Blood, UA small (A) negative   pH, UA 6.0    Protein Ur, POC =30 (A) negative   Urobilinogen, UA 0.2    Nitrite, UA Negative Negative   Leukocytes, UA moderate (2+) (A) Negative  POCT Microscopic Urinalysis ([facility])     Status: Abnormal   Collection Time: 11/10/16  2:40 PM  Result Value Ref Range   WBC,UR,HPF,POC Many (A) None WBC/hpf   RBC,UR,HPF,POC None None RBC/hpf   Bacteria None None, Too numerous to count   Mucus Absent Absent   Epithelial Cells, UR Per Microscopy None None, Too numerous to count cells/hpf  POCT CBC     Status: Abnormal   Collection Time: 11/10/16  3:11 PM  Result Value Ref Range   WBC 8.6 4.6 - 10.2 K/uL   Lymph, poc 3.1 0.6 - 3.4   POC LYMPH PERCENT 35.9 10 - 50 %L   MID (cbc) 0.6 0 - 0.9   POC MID % 6.7 0 - 12 %M   POC Granulocyte 4.9 2 - 6.9   Granulocyte percent 57.4 37 - 80 %G   RBC 4.48 4.04 - 5.48 M/uL   Hemoglobin 12.8 12.2 - 16.2 g/dL   HCT, POC 37.2 (A) 37.7 - 47.9 %   MCV 82.9 80 - 97 fL   MCH, POC 28.6 27 - 31.2 pg   MCHC 34.5 31.8 - 35.4 g/dL   RDW, POC 13.7 %   Platelet Count, POC 206 142 - 424 K/uL   MPV 8.5 0 - 99.8 fL    Assessment and Plan :  This case was precepted with Dr. Brigitte Pulse  1. Urine frequency Await results - POCT urinalysis dipstick - POCT Microscopic Urinalysis ([facility]) - POCT CBC - CMP14+EGFR - Urine culture  2. Urinary tract infection without hematuria, site unspecified -Will treat for possible pyelonephritis due to history, exam findings, and comorbidities.  - cefTRIAXone (ROCEPHIN) injection 1 g; Inject 1 g into the muscle once. - cephALEXin (KEFLEX) 500 MG capsule; Take 1 capsule (500 mg total) by mouth 3 (three) times daily.  Dispense: 30 capsule; Refill: 0 -Pt instructed  to follow up in three days for reevaluation. Instructed to seek care sooner if her symptoms worsen or she develops any new concerning symptoms  Tenna Delaine PA-C  [facility] 11/10/2016 3:37 PM

## 2016-11-10 NOTE — Patient Instructions (Addendum)
Follow up in three days for reevaluation. If symptoms worsen or you develop any new concerning symptoms seek care sooner.   Pyelonephritis, Adult Introduction Pyelonephritis is a kidney infection. The kidneys are organs that help clean your blood by moving waste out of your blood and into your pee (urine). This infection can happen quickly, or it can last for a long time. In most cases, it clears up with treatment and does not cause other problems. Follow these instructions at home: Medicines  Take over-the-counter and prescription medicines only as told by your doctor.  Take your antibiotic medicine as told by your doctor. Do not stop taking the medicine even if you start to feel better. General instructions  Drink enough fluid to keep your pee clear or pale yellow.  Avoid caffeine, tea, and carbonated drinks.  Pee (urinate) often. Avoid holding in pee for long periods of time.  Pee before and after sex.  After pooping (having a bowel movement), women should wipe from front to back. Use each tissue only once.  Keep all follow-up visits as told by your doctor. This is important. Contact a doctor if:  You do not feel better after 2 days.  Your symptoms get worse.  You have a fever. Get help right away if:  You cannot take your medicine or drink fluids as told.  You have chills and shaking.  You throw up (vomit).  You have very bad pain in your side (flank) or back.  You feel very weak or you pass out (faint). This information is not intended to replace advice given to you by your health care provider. Make sure you discuss any questions you have with your health care provider. Document Released: 12/17/2004 Document Revised: 04/16/2016 Document Reviewed: 03/04/2015  2017 Elsevier    IF you received an x-ray today, you will receive an invoice from [facility]. Please contact [facility] at [phone] with questions or concerns regarding your invoice.    IF you received labwork today, you will receive an invoice from [facility]. Please contact [facility] at [phone] with questions or concerns regarding your invoice.   Our billing staff will not be able to assist you with questions regarding bills from these companies.  You will be contacted with the lab results as soon as they are available. The fastest way to get your results is to activate your My Chart account. Instructions are located on the last page of this paperwork. If you have not heard from us regarding the results in 2 weeks, please contact this office.

## 2016-11-11 LAB — CMP14+EGFR
ALBUMIN: 4 g/dL (ref 3.5–5.5)
ALT: 21 IU/L (ref 0–32)
AST: 17 IU/L (ref 0–40)
Albumin/Globulin Ratio: 1.1 — ABNORMAL LOW (ref 1.2–2.2)
Alkaline Phosphatase: 63 IU/L (ref 39–117)
BILIRUBIN TOTAL: 0.3 mg/dL (ref 0.0–1.2)
BUN / CREAT RATIO: 11 (ref 9–23)
BUN: 7 mg/dL (ref 6–24)
CALCIUM: 9.1 mg/dL (ref 8.7–10.2)
CHLORIDE: 101 mmol/L (ref 96–106)
CO2: 24 mmol/L (ref 18–29)
CREATININE: 0.62 mg/dL (ref 0.57–1.00)
GFR calc non Af Amer: 109 mL/min/{1.73_m2} (ref >59)
GFR, EST AFRICAN AMERICAN: 126 mL/min/{1.73_m2} (ref >59)
GLUCOSE: 158 mg/dL — AB (ref 65–99)
Globulin, Total: 3.5 g/dL (ref 1.5–4.5)
Potassium: 4.6 mmol/L (ref 3.5–5.2)
Sodium: 141 mmol/L (ref 134–144)
TOTAL PROTEIN: 7.5 g/dL (ref 6.0–8.5)

## 2016-11-13 ENCOUNTER — Ambulatory Visit (INDEPENDENT_AMBULATORY_CARE_PROVIDER_SITE_OTHER): Payer: BLUE CROSS/BLUE SHIELD | Admitting: Physician Assistant

## 2016-11-13 ENCOUNTER — Encounter: Payer: Self-pay | Admitting: Physician Assistant

## 2016-11-13 VITALS — BP 122/80 | HR 98 | Temp 98.4°F | Resp 18 | Ht 62.0 in | Wt 180.0 lb

## 2016-11-13 DIAGNOSIS — Z87448 Personal history of other diseases of urinary system: Secondary | ICD-10-CM | POA: Diagnosis not present

## 2016-11-13 LAB — POCT URINALYSIS DIP (MANUAL ENTRY)
BILIRUBIN UA: NEGATIVE
GLUCOSE UA: NEGATIVE
Ketones, POC UA: NEGATIVE
Leukocytes, UA: NEGATIVE
Nitrite, UA: NEGATIVE
PH UA: 6
Protein Ur, POC: NEGATIVE
SPEC GRAV UA: 1.015
UROBILINOGEN UA: 0.2

## 2016-11-13 LAB — POC MICROSCOPIC URINALYSIS (UMFC): Mucus: ABSENT

## 2016-11-13 LAB — URINE CULTURE

## 2016-11-13 NOTE — Patient Instructions (Addendum)
  Keep taking the antibiotics and follow up if symptoms become worse or do not improve after entire course of antibiotics.  Happy holidays and happy birthday!    IF you received an x-ray today, you will receive an invoice from [facility]. Please contact [facility] at [phone] with questions or concerns regarding your invoice.   IF you received labwork today, you will receive an invoice from [facility]. Please contact [facility] at [phone] with questions or concerns regarding your invoice.   Our billing staff will not be able to assist you with questions regarding bills from these companies.  You will be contacted with the lab results as soon as they are available. The fastest way to get your results is to activate your My Chart account. Instructions are located on the last page of this paperwork. If you have not heard from us regarding the results in 2 weeks, please contact this office.

## 2016-11-13 NOTE — Progress Notes (Signed)
    MRN: [id number] DOB: Mar 08, 1971  Subjective:   Karen Peterson is a 45 y.o. female presenting for follow up on possible pyelonephritis. Pt initially seen on 11/10/16 with right sided flank pain, urinary urgency and frequency. UA showed moderate leukocytes and many WBCs. Due to comorbidities and exam findings, pt was given 1g rocephin and started on oral keflex. She is taking the antibiotics as prescribed and drinking lots of water. She notes she feels so much better. Denies flank pain, urinary symptoms, fever, palpitations, and chills. Urine culture shows E.coli which is sensitive to cephalosporins.   She is followed by Dr. Talmage NapBalan for diabetes management. She has a follow up appointment in 11/2016.  Karen Peterson has a current medication list which includes the following prescription(s): alendronate, cephalexin, eszopiclone, fluticasone, glimepiride, kombiglyze xr, metformin, norethindrone-ethinyl estradiol, and simvastatin. Also has No Known Allergies.  Karen Peterson  has a past medical history of Decreased bone mass; Diabetes mellitus; High cholesterol; Hyperprolactinemia ([facility]); Insomnia; Premature ovarian failure; and Vitamin D deficiency. Also  has a past surgical history that includes Tubal ligation and Cholecystectomy.   Objective:   Vitals: BP 122/80 (BP Location: Right Arm, Patient Position: Sitting, Cuff Size: Small)   Pulse 98   Temp 98.4 F (36.9 C) (Oral)   Resp 18   Ht 5\' 2"  (1.575 m)   Wt 180 lb (81.6 kg)   LMP 10/19/2016   SpO2 100%   BMI 32.92 kg/m   Physical Exam  Constitutional: She is oriented to person, place, and time. She appears well-developed and well-nourished.  HENT:  Head: Normocephalic and atraumatic.  Eyes: Conjunctivae are normal.  Neck: Normal range of motion.  Cardiovascular: Normal rate, regular rhythm and normal heart sounds.   Pulmonary/Chest: Effort normal.  Abdominal: Soft. Normal appearance and bowel sounds are normal. There is no tenderness. There is no CVA  tenderness.  Neurological: She is alert and oriented to person, place, and time.  Skin: Skin is warm and dry.  Psychiatric: She has a normal mood and affect.  Vitals reviewed.   Results for orders placed or performed in visit on 11/13/16 (from the past 24 hour(s))  POCT urinalysis dipstick     Status: Abnormal   Collection Time: 11/13/16  2:31 PM  Result Value Ref Range   Color, UA yellow yellow   Clarity, UA clear clear   Glucose, UA negative negative   Bilirubin, UA negative negative   Ketones, POC UA negative negative   Spec Grav, UA 1.015    Blood, UA trace-lysed (A) negative   pH, UA 6.0    Protein Ur, POC negative negative   Urobilinogen, UA 0.2    Nitrite, UA Negative Negative   Leukocytes, UA Negative Negative  POCT Microscopic Urinalysis ([facility])     Status: None   Collection Time: 11/13/16  2:32 PM  Result Value Ref Range   WBC,UR,HPF,POC None None WBC/hpf   RBC,UR,HPF,POC None None RBC/hpf   Bacteria None None, Too numerous to count   Mucus Absent Absent   Epithelial Cells, UR Per Microscopy None None, Too numerous to count cells/hpf    Assessment and Plan :  1. History of pyelonephritis Improving, continue antibiotics as prescribed.  - POCT urinalysis dipstick - POCT Microscopic Urinalysis ([facility]) -Return to clinic if symptoms worsen or as needed   Benjiman CoreBrittany Amaro Mangold, PA-C  [facility] 11/13/2016 2:34 PM

## 2017-02-25 ENCOUNTER — Other Ambulatory Visit: Payer: Self-pay | Admitting: Gynecology

## 2017-02-25 DIAGNOSIS — Z1231 Encounter for screening mammogram for malignant neoplasm of breast: Secondary | ICD-10-CM

## 2017-03-15 ENCOUNTER — Ambulatory Visit
Admission: RE | Admit: 2017-03-15 | Discharge: 2017-03-15 | Disposition: A | Discharge status: Home / Self Care | Payer: BLUE CROSS/BLUE SHIELD | Source: Ambulatory Visit | Attending: Gynecology | Admitting: Gynecology

## 2017-03-15 DIAGNOSIS — Z1231 Encounter for screening mammogram for malignant neoplasm of breast: Secondary | ICD-10-CM

## 2017-04-07 ENCOUNTER — Encounter: Payer: Self-pay | Admitting: Gynecology

## 2017-12-23 ENCOUNTER — Encounter: Payer: Self-pay | Admitting: Obstetrics & Gynecology

## 2017-12-23 ENCOUNTER — Ambulatory Visit: Payer: 59 | Admitting: Obstetrics & Gynecology

## 2017-12-23 VITALS — BP 136/84 | Ht 62.0 in | Wt 181.0 lb

## 2017-12-23 DIAGNOSIS — E2839 Other primary ovarian failure: Secondary | ICD-10-CM

## 2017-12-23 DIAGNOSIS — Z01419 Encounter for gynecological examination (general) (routine) without abnormal findings: Secondary | ICD-10-CM | POA: Diagnosis not present

## 2017-12-23 DIAGNOSIS — M818 Other osteoporosis without current pathological fracture: Secondary | ICD-10-CM

## 2017-12-23 DIAGNOSIS — E288 Other ovarian dysfunction: Secondary | ICD-10-CM

## 2017-12-23 DIAGNOSIS — R87610 Atypical squamous cells of undetermined significance on cytologic smear of cervix (ASC-US): Secondary | ICD-10-CM | POA: Diagnosis not present

## 2017-12-23 MED ORDER — ALENDRONATE SODIUM 70 MG PO TABS
70.0000 mg | ORAL_TABLET | ORAL | 4 refills | Status: DC
Start: 2017-12-23 — End: 2020-08-25

## 2017-12-23 MED ORDER — NORETHINDRONE ACET-ETHINYL EST 1-20 MG-MCG PO TABS: 1.0000 | ORAL_TABLET | Freq: Every day | ORAL | 5 refills | Status: DC

## 2017-12-23 NOTE — Progress Notes (Signed)
Karen Peterson 1971-01-17 [id number]   History:    47 y.o. G4P4L4 Married.  S/P Tubal Ligation.  Children 14-48 yo.  Has grand-children.  RP:  Established patient presenting for annual gyn exam   HPI: Premature Ovarian Failure in 2005 at 47 yo.  On Junel 1/20 continuously.  Has very light periods on it.  No pelvic pain.  Mild dryness with IC.  Not using lubricant currently.  Urine/BMs wnl.  Breasts wnl.  Osteoporosis on Fosamax x 2 years.  Takes Vit D and Ca++ supplement.  Followed by Dr Talmage NapBalan for DM, H/O Hyperprolactinemia, Hypercholesterolemia.  Health labs with Dr Talmage NapBalan.  Past medical history,surgical history, family history and social history were all reviewed and documented in the EPIC chart.  Gynecologic History No LMP recorded. Patient is not currently having periods (Reason: Oral contraceptives). Contraception: tubal ligation Last Pap: 09/2016. Results were: ASCUS/HR HPV negative Last mammogram: 02/2017. Results were: Negative Bone Density: 07/2015 Osteoporosis Colonoscopy: Never  Obstetric History OB History  Gravida Para Term Preterm AB Living  4 4 4     4   SAB TAB Ectopic Multiple Live Births          4    # Outcome Date GA Lbr Len/2nd Weight Sex Delivery Anes PTL Lv  4 Term     F Vag-Spont  N LIV  3 Term     F Vag-Spont  N LIV  2 Term     M Vag-Spont  N LIV  1 Term     F Vag-Spont  N LIV       ROS: A ROS was performed and pertinent positives and negatives are included in the history.  GENERAL: No fevers or chills. HEENT: No change in vision, no earache, sore throat or sinus congestion. NECK: No pain or stiffness. CARDIOVASCULAR: No chest pain or pressure. No palpitations. PULMONARY: No shortness of breath, cough or wheeze. GASTROINTESTINAL: No abdominal pain, nausea, vomiting or diarrhea, melena or bright red blood per rectum. GENITOURINARY: No urinary frequency, urgency, hesitancy or dysuria. MUSCULOSKELETAL: No joint or muscle pain, no back pain, no recent trauma.  DERMATOLOGIC: No rash, no itching, no lesions. ENDOCRINE: No polyuria, polydipsia, no heat or cold intolerance. No recent change in weight. HEMATOLOGICAL: No anemia or easy bruising or bleeding. NEUROLOGIC: No headache, seizures, numbness, tingling or weakness. PSYCHIATRIC: No depression, no loss of interest in normal activity or change in sleep pattern.     Exam:   BP 136/84   Ht 5\' 2"  (1.575 m)   Wt 181 lb (82.1 kg)   BMI 33.11 kg/m   Body mass index is 33.11 kg/m.  General appearance : Well developed well nourished female. No acute distress HEENT: Eyes: no retinal hemorrhage or exudates,  Neck supple, trachea midline, no carotid bruits, no thyroidmegaly Lungs: Clear to auscultation, no rhonchi or wheezes, or rib retractions  Heart: Regular rate and rhythm, no murmurs or gallops Breast:Examined in sitting and supine position were symmetrical in appearance, no palpable masses or tenderness,  no skin retraction, no nipple inversion, no nipple discharge, no skin discoloration, no axillary or supraclavicular lymphadenopathy Abdomen: no palpable masses or tenderness, no rebound or guarding Extremities: no edema or skin discoloration or tenderness  Pelvic: Vulva: Normal             Vagina: No gross lesions or discharge  Cervix: No gross lesions or discharge.  Pap reflex done.  Uterus  AV, normal size, shape and consistency, non-tender and mobile  Adnexa  Without masses or tenderness  Anus: Normal   Assessment/Plan:  47 y.o. female for annual exam   1. Encounter for routine gynecological examination with Papanicolaou smear of cervix Normal gynecologic exam.  Pap reflex done today.  Breast exam normal.  Will schedule screening mammogram in April 2019.  Health labs with Dr. Talmage Nap.  2. Premature ovarian failure Well on continuous generic of Loestrin 1/20.  No contraindication.  Will continue on it until age 62.  3. Other osteoporosis without current pathological fracture History of  osteoporosis.  Continue on Fosamax 70 mg every week.  Has been on Fosamax for 2 years.  Informed that will change treatment after bone density per results or continue on Fosamax for a maximum of 3 years total.  Will reevaluate treatment based on bone density that patient will do here as soon as possible.  Continue on vitamin D and calcium supplements.  Recommend regular weightbearing physical activity. - DG Bone Density; Future  Other orders - alendronate (FOSAMAX) 70 MG tablet; Take 1 tablet (70 mg total) by mouth every 7 (seven) days. Take with a full glass of water on an empty stomach. - norethindrone-ethinyl estradiol (MICROGESTIN,JUNEL,LOESTRIN) 1-20 MG-MCG tablet; Take 1 tablet by mouth daily.   Genia Del MD, 2:35 PM 12/23/2017

## 2017-12-24 ENCOUNTER — Encounter: Payer: Self-pay | Admitting: Obstetrics & Gynecology

## 2017-12-24 NOTE — Patient Instructions (Signed)
1. Encounter for routine gynecological examination with Papanicolaou smear of cervix Normal gynecologic exam.  Pap reflex done today.  Breast exam normal.  Will schedule screening mammogram in April 2019.  Health labs with Dr. Chalmers Cater.  2. Premature ovarian failure Well on continuous generic of Loestrin 1/20.  No contraindication.  Will continue on it until age 47.  71. Other osteoporosis without current pathological fracture History of osteoporosis.  Continue on Fosamax 70 mg every week.  Has been on Fosamax for 2 years.  Informed that will change treatment after bone density per results or continue on Fosamax for a maximum of 3 years total.  Will reevaluate treatment based on bone density that patient will do here as soon as possible.  Continue on vitamin D and calcium supplements.  Recommend regular weightbearing physical activity. - DG Bone Density; Future  Other orders - alendronate (FOSAMAX) 70 MG tablet; Take 1 tablet (70 mg total) by mouth every 7 (seven) days. Take with a full glass of water on an empty stomach. - norethindrone-ethinyl estradiol (MICROGESTIN,JUNEL,LOESTRIN) 1-20 MG-MCG tablet; Take 1 tablet by mouth daily.  Karen Peterson, it was a pleasure meeting you today!  I will inform you of your results as soon as they are available.   Health Maintenance for Postmenopausal Women Menopause is a normal process in which your reproductive ability comes to an end. This process happens gradually over a span of months to years, usually between the ages of 107 and 67. Menopause is complete when you have missed 12 consecutive menstrual periods. It is important to talk with your health care provider about some of the most common conditions that affect postmenopausal women, such as heart disease, cancer, and bone loss (osteoporosis). Adopting a healthy lifestyle and getting preventive care can help to promote your health and wellness. Those actions can also lower your chances of developing some of these  common conditions. What should I know about menopause? During menopause, you may experience a number of symptoms, such as:  Moderate-to-severe hot flashes.  Night sweats.  Decrease in sex drive.  Mood swings.  Headaches.  Tiredness.  Irritability.  Memory problems.  Insomnia.  Choosing to treat or not to treat menopausal changes is an individual decision that you make with your health care provider. What should I know about hormone replacement therapy and supplements? Hormone therapy products are effective for treating symptoms that are associated with menopause, such as hot flashes and night sweats. Hormone replacement carries certain risks, especially as you become older. If you are thinking about using estrogen or estrogen with progestin treatments, discuss the benefits and risks with your health care provider. What should I know about heart disease and stroke? Heart disease, heart attack, and stroke become more likely as you age. This may be due, in part, to the hormonal changes that your body experiences during menopause. These can affect how your body processes dietary fats, triglycerides, and cholesterol. Heart attack and stroke are both medical emergencies. There are many things that you can do to help prevent heart disease and stroke:  Have your blood pressure checked at least every 1-2 years. High blood pressure causes heart disease and increases the risk of stroke.  If you are 58-104 years old, ask your health care provider if you should take aspirin to prevent a heart attack or a stroke.  Do not use any tobacco products, including cigarettes, chewing tobacco, or electronic cigarettes. If you need help quitting, ask your health care provider.  It is important  to eat a healthy diet and maintain a healthy weight. ? Be sure to include plenty of vegetables, fruits, low-fat dairy products, and lean protein. ? Avoid eating foods that are high in solid fats, added sugars, or  salt (sodium).  Get regular exercise. This is one of the most important things that you can do for your health. ? Try to exercise for at least 150 minutes each week. The type of exercise that you do should increase your heart rate and make you sweat. This is known as moderate-intensity exercise. ? Try to do strengthening exercises at least twice each week. Do these in addition to the moderate-intensity exercise.  Know your numbers.Ask your health care provider to check your cholesterol and your blood glucose. Continue to have your blood tested as directed by your health care provider.  What should I know about cancer screening? There are several types of cancer. Take the following steps to reduce your risk and to catch any cancer development as early as possible. Breast Cancer  Practice breast self-awareness. ? This means understanding how your breasts normally appear and feel. ? It also means doing regular breast self-exams. Let your health care provider know about any changes, no matter how small.  If you are 49 or older, have a clinician do a breast exam (clinical breast exam or CBE) every year. Depending on your age, family history, and medical history, it may be recommended that you also have a yearly breast X-ray (mammogram).  If you have a family history of breast cancer, talk with your health care provider about genetic screening.  If you are at high risk for breast cancer, talk with your health care provider about having an MRI and a mammogram every year.  Breast cancer (BRCA) gene test is recommended for women who have family members with BRCA-related cancers. Results of the assessment will determine the need for genetic counseling and BRCA1 and for BRCA2 testing. BRCA-related cancers include these types: ? Breast. This occurs in males or females. ? Ovarian. ? Tubal. This may also be called fallopian tube cancer. ? Cancer of the abdominal or pelvic lining (peritoneal  cancer). ? Prostate. ? Pancreatic.  Cervical, Uterine, and Ovarian Cancer Your health care provider may recommend that you be screened regularly for cancer of the pelvic organs. These include your ovaries, uterus, and vagina. This screening involves a pelvic exam, which includes checking for microscopic changes to the surface of your cervix (Pap test).  For women ages 21-65, health care providers may recommend a pelvic exam and a Pap test every three years. For women ages 66-65, they may recommend the Pap test and pelvic exam, combined with testing for human papilloma virus (HPV), every five years. Some types of HPV increase your risk of cervical cancer. Testing for HPV may also be done on women of any age who have unclear Pap test results.  Other health care providers may not recommend any screening for nonpregnant women who are considered low risk for pelvic cancer and have no symptoms. Ask your health care provider if a screening pelvic exam is right for you.  If you have had past treatment for cervical cancer or a condition that could lead to cancer, you need Pap tests and screening for cancer for at least 20 years after your treatment. If Pap tests have been discontinued for you, your risk factors (such as having a new sexual partner) need to be reassessed to determine if you should start having screenings again. Some women  have medical problems that increase the chance of getting cervical cancer. In these cases, your health care provider may recommend that you have screening and Pap tests more often.  If you have a family history of uterine cancer or ovarian cancer, talk with your health care provider about genetic screening.  If you have vaginal bleeding after reaching menopause, tell your health care provider.  There are currently no reliable tests available to screen for ovarian cancer.  Lung Cancer Lung cancer screening is recommended for adults 37-7 years old who are at high risk for  lung cancer because of a history of smoking. A yearly low-dose CT scan of the lungs is recommended if you:  Currently smoke.  Have a history of at least 30 pack-years of smoking and you currently smoke or have quit within the past 15 years. A pack-year is smoking an average of one pack of cigarettes per day for one year.  Yearly screening should:  Continue until it has been 15 years since you quit.  Stop if you develop a health problem that would prevent you from having lung cancer treatment.  Colorectal Cancer  This type of cancer can be detected and can often be prevented.  Routine colorectal cancer screening usually begins at age 51 and continues through age 52.  If you have risk factors for colon cancer, your health care provider may recommend that you be screened at an earlier age.  If you have a family history of colorectal cancer, talk with your health care provider about genetic screening.  Your health care provider may also recommend using home test kits to check for hidden blood in your stool.  A small camera at the end of a tube can be used to examine your colon directly (sigmoidoscopy or colonoscopy). This is done to check for the earliest forms of colorectal cancer.  Direct examination of the colon should be repeated every 5-10 years until age 75. However, if early forms of precancerous polyps or small growths are found or if you have a family history or genetic risk for colorectal cancer, you may need to be screened more often.  Skin Cancer  Check your skin from head to toe regularly.  Monitor any moles. Be sure to tell your health care provider: ? About any new moles or changes in moles, especially if there is a change in a mole's shape or color. ? If you have a mole that is larger than the size of a pencil eraser.  If any of your family members has a history of skin cancer, especially at a young age, talk with your health care provider about genetic  screening.  Always use sunscreen. Apply sunscreen liberally and repeatedly throughout the day.  Whenever you are outside, protect yourself by wearing long sleeves, pants, a wide-brimmed hat, and sunglasses.  What should I know about osteoporosis? Osteoporosis is a condition in which bone destruction happens more quickly than new bone creation. After menopause, you may be at an increased risk for osteoporosis. To help prevent osteoporosis or the bone fractures that can happen because of osteoporosis, the following is recommended:  If you are 12-37 years old, get at least 1,000 mg of calcium and at least 600 mg of vitamin D per day.  If you are older than age 68 but younger than age 46, get at least 1,200 mg of calcium and at least 600 mg of vitamin D per day.  If you are older than age 91, get at least  1,200 mg of calcium and at least 800 mg of vitamin D per day.  Smoking and excessive alcohol intake increase the risk of osteoporosis. Eat foods that are rich in calcium and vitamin D, and do weight-bearing exercises several times each week as directed by your health care provider. What should I know about how menopause affects my mental health? Depression may occur at any age, but it is more common as you become older. Common symptoms of depression include:  Low or sad mood.  Changes in sleep patterns.  Changes in appetite or eating patterns.  Feeling an overall lack of motivation or enjoyment of activities that you previously enjoyed.  Frequent crying spells.  Talk with your health care provider if you think that you are experiencing depression. What should I know about immunizations? It is important that you get and maintain your immunizations. These include:  Tetanus, diphtheria, and pertussis (Tdap) booster vaccine.  Influenza every year before the flu season begins.  Pneumonia vaccine.  Shingles vaccine.  Your health care provider may also recommend other  immunizations. This information is not intended to replace advice given to you by your health care provider. Make sure you discuss any questions you have with your health care provider. Document Released: 01/01/2006 Document Revised: 05/29/2016 Document Reviewed: 08/13/2015 Elsevier Interactive Patient Education  2018 [employer].

## 2017-12-30 LAB — PAP IG W/ RFLX HPV ASCU

## 2017-12-30 LAB — HUMAN PAPILLOMAVIRUS, HIGH RISK: HPV DNA HIGH RISK: NOT DETECTED

## 2018-02-08 ENCOUNTER — Other Ambulatory Visit: Payer: Self-pay | Admitting: Endocrinology

## 2018-02-08 DIAGNOSIS — Z1231 Encounter for screening mammogram for malignant neoplasm of breast: Secondary | ICD-10-CM

## 2018-02-09 ENCOUNTER — Other Ambulatory Visit: Payer: Self-pay | Admitting: Gynecology

## 2018-02-09 ENCOUNTER — Other Ambulatory Visit: Payer: Self-pay | Admitting: Obstetrics & Gynecology

## 2018-02-09 DIAGNOSIS — M810 Age-related osteoporosis without current pathological fracture: Secondary | ICD-10-CM

## 2018-02-21 DIAGNOSIS — M810 Age-related osteoporosis without current pathological fracture: Secondary | ICD-10-CM

## 2018-02-21 HISTORY — DX: Age-related osteoporosis without current pathological fracture: M81.0

## 2018-02-23 ENCOUNTER — Other Ambulatory Visit: Payer: Self-pay | Admitting: Gynecology

## 2018-02-23 ENCOUNTER — Ambulatory Visit (INDEPENDENT_AMBULATORY_CARE_PROVIDER_SITE_OTHER): Payer: 59

## 2018-02-23 DIAGNOSIS — M810 Age-related osteoporosis without current pathological fracture: Secondary | ICD-10-CM | POA: Diagnosis not present

## 2018-02-24 ENCOUNTER — Encounter: Payer: Self-pay | Admitting: Gynecology

## 2018-03-16 ENCOUNTER — Ambulatory Visit
Admission: RE | Admit: 2018-03-16 | Discharge: 2018-03-16 | Disposition: A | Discharge status: Home / Self Care | Payer: 59 | Source: Ambulatory Visit | Attending: Endocrinology | Admitting: Endocrinology

## 2018-03-16 DIAGNOSIS — Z1231 Encounter for screening mammogram for malignant neoplasm of breast: Secondary | ICD-10-CM

## 2018-03-29 DIAGNOSIS — N390 Urinary tract infection, site not specified: Secondary | ICD-10-CM | POA: Diagnosis not present

## 2018-04-02 ENCOUNTER — Ambulatory Visit: Payer: 59 | Admitting: Physician Assistant

## 2018-04-14 DIAGNOSIS — E0590 Thyrotoxicosis, unspecified without thyrotoxic crisis or storm: Secondary | ICD-10-CM | POA: Diagnosis not present

## 2018-04-14 DIAGNOSIS — E1165 Type 2 diabetes mellitus with hyperglycemia: Secondary | ICD-10-CM | POA: Diagnosis not present

## 2018-04-14 DIAGNOSIS — E7800 Pure hypercholesterolemia, unspecified: Secondary | ICD-10-CM | POA: Diagnosis not present

## 2018-04-21 DIAGNOSIS — E7800 Pure hypercholesterolemia, unspecified: Secondary | ICD-10-CM | POA: Diagnosis not present

## 2018-04-21 DIAGNOSIS — E221 Hyperprolactinemia: Secondary | ICD-10-CM | POA: Diagnosis not present

## 2018-04-21 DIAGNOSIS — E1165 Type 2 diabetes mellitus with hyperglycemia: Secondary | ICD-10-CM | POA: Diagnosis not present

## 2018-10-11 DIAGNOSIS — N390 Urinary tract infection, site not specified: Secondary | ICD-10-CM | POA: Diagnosis not present

## 2018-10-18 DIAGNOSIS — E1165 Type 2 diabetes mellitus with hyperglycemia: Secondary | ICD-10-CM | POA: Diagnosis not present

## 2018-10-18 DIAGNOSIS — E7800 Pure hypercholesterolemia, unspecified: Secondary | ICD-10-CM | POA: Diagnosis not present

## 2018-10-18 DIAGNOSIS — E0590 Thyrotoxicosis, unspecified without thyrotoxic crisis or storm: Secondary | ICD-10-CM | POA: Diagnosis not present

## 2018-10-18 DIAGNOSIS — E221 Hyperprolactinemia: Secondary | ICD-10-CM | POA: Diagnosis not present

## 2018-10-27 DIAGNOSIS — E7800 Pure hypercholesterolemia, unspecified: Secondary | ICD-10-CM | POA: Diagnosis not present

## 2018-10-27 DIAGNOSIS — E1165 Type 2 diabetes mellitus with hyperglycemia: Secondary | ICD-10-CM | POA: Diagnosis not present

## 2018-10-27 DIAGNOSIS — E221 Hyperprolactinemia: Secondary | ICD-10-CM | POA: Diagnosis not present

## 2018-10-27 DIAGNOSIS — Z23 Encounter for immunization: Secondary | ICD-10-CM | POA: Diagnosis not present

## 2018-12-08 ENCOUNTER — Other Ambulatory Visit: Payer: Self-pay | Admitting: Endocrinology

## 2018-12-08 DIAGNOSIS — Z1231 Encounter for screening mammogram for malignant neoplasm of breast: Secondary | ICD-10-CM

## 2019-02-07 ENCOUNTER — Encounter: Payer: 59 | Admitting: Obstetrics & Gynecology

## 2019-02-17 ENCOUNTER — Other Ambulatory Visit: Payer: Self-pay

## 2019-02-21 ENCOUNTER — Encounter: Payer: Self-pay | Admitting: Obstetrics & Gynecology

## 2019-02-21 ENCOUNTER — Other Ambulatory Visit: Payer: Self-pay

## 2019-02-21 ENCOUNTER — Ambulatory Visit: Payer: 59 | Admitting: Obstetrics & Gynecology

## 2019-02-21 VITALS — BP 128/86 | Ht 62.0 in | Wt 182.0 lb

## 2019-02-21 DIAGNOSIS — Z01419 Encounter for gynecological examination (general) (routine) without abnormal findings: Secondary | ICD-10-CM | POA: Diagnosis not present

## 2019-02-21 DIAGNOSIS — E66811 Obesity, class 1: Secondary | ICD-10-CM

## 2019-02-21 DIAGNOSIS — E6609 Other obesity due to excess calories: Secondary | ICD-10-CM

## 2019-02-21 DIAGNOSIS — Z780 Asymptomatic menopausal state: Secondary | ICD-10-CM | POA: Diagnosis not present

## 2019-02-21 DIAGNOSIS — R87610 Atypical squamous cells of undetermined significance on cytologic smear of cervix (ASC-US): Secondary | ICD-10-CM | POA: Diagnosis not present

## 2019-02-21 DIAGNOSIS — Z6833 Body mass index (BMI) 33.0-33.9, adult: Secondary | ICD-10-CM

## 2019-02-21 DIAGNOSIS — Z9851 Tubal ligation status: Secondary | ICD-10-CM

## 2019-02-21 DIAGNOSIS — M8588 Other specified disorders of bone density and structure, other site: Secondary | ICD-10-CM

## 2019-02-21 NOTE — Addendum Note (Signed)
Addended by: Berna Spare A on: 02/21/2019 04:04 PM   Modules accepted: Orders

## 2019-02-21 NOTE — Patient Instructions (Signed)
1. Encounter for routine gynecological examination with Papanicolaou smear of cervix Normal gynecologic exam.  Pap test January 2019 showed ASCUS with negative high-risk HPV, Pap reflex done today.  Breast exam normal.  Patient is scheduled for a screening mammogram.  Health labs with Dr. Talmage Nap, who follows her for type 2 diabetes mellitus.  2. ASCUS of cervix with negative high risk HPV Pap reflex done today.  3. S/P tubal ligation  4. Menopause present Menopause with no postmenopausal bleeding.  Will stop Junel 1/20 now.  Patient will follow-up with me if develops menopausal symptoms off of the birth control pills.  5. Osteopenia of lumbar spine Bone density showing osteopenia at the spine with a T score of -1.8.  Has been on Fosamax for about 3 years, will stop now.  We will repeat a bone density in April 2021.  Continue with vitamin D supplements, calcium intake of 1.5 g/day total and regular weightbearing physical activities.  6. Class 1 obesity due to excess calories without serious comorbidity with body mass index (BMI) of 33.0 to 33.9 in adult Recommend lower calorie/carb diet such as [employer].  Aerobic physical activities 5 times a week with weightlifting every 2 days.  Gerrianne, it was a pleasure seeing you today!  I will inform you of your results as soon as they are available.

## 2019-02-21 NOTE — Progress Notes (Signed)
Karen Peterson 1971/04/01 [id number]   History:    48 y.o. G4P4L4  Married.  S/P TL.  RP:  Established patient presenting for annual gyn exam   HPI: Premature Ovarian Failure/menopause.  On Junel 1/20.  No postmenopausal bleeding.  On Fosamax x >3 yrs.  Last Bone Density very good with only Osteopenia at the Spine, T-Score -1.8.  Taking vitamin D supplements with calcium.  No pain with intercourse.  Breast normal.  Body mass index 33.29.  Needs to exercise more.  Followed by Dr. Talmage Nap for type 2 diabetes mellitus.  Health labs with Dr. Talmage Nap.  Past medical history,surgical history, family history and social history were all reviewed and documented in the EPIC chart.  Gynecologic History  Patient's last menstrual period was 10/19/2016. Contraception: tubal ligation Last Pap: 11/2017. Results were: ASCUS/HPV HR negative Last mammogram: 02/2018. Results were: Negative Bone Density: 02/2018 Osteopenia Spine T-Score -1.8 Colonoscopy: Never  Obstetric History OB History  Gravida Para Term Preterm AB Living  4 4 4     4   SAB TAB Ectopic Multiple Live Births          4    # Outcome Date GA Lbr Len/2nd Weight Sex Delivery Anes PTL Lv  4 Term     F Vag-Spont  N LIV  3 Term     F Vag-Spont  N LIV  2 Term     M Vag-Spont  N LIV  1 Term     F Vag-Spont  N LIV     ROS: A ROS was performed and pertinent positives and negatives are included in the history.  GENERAL: No fevers or chills. HEENT: No change in vision, no earache, sore throat or sinus congestion. NECK: No pain or stiffness. CARDIOVASCULAR: No chest pain or pressure. No palpitations. PULMONARY: No shortness of breath, cough or wheeze. GASTROINTESTINAL: No abdominal pain, nausea, vomiting or diarrhea, melena or bright red blood per rectum. GENITOURINARY: No urinary frequency, urgency, hesitancy or dysuria. MUSCULOSKELETAL: No joint or muscle pain, no back pain, no recent trauma. DERMATOLOGIC: No rash, no itching, no lesions. ENDOCRINE:  No polyuria, polydipsia, no heat or cold intolerance. No recent change in weight. HEMATOLOGICAL: No anemia or easy bruising or bleeding. NEUROLOGIC: No headache, seizures, numbness, tingling or weakness. PSYCHIATRIC: No depression, no loss of interest in normal activity or change in sleep pattern.     Exam:   BP 128/86   Ht 5\' 2"  (1.575 m)   Wt 182 lb (82.6 kg)   LMP 10/19/2016   BMI 33.29 kg/m   Body mass index is 33.29 kg/m.  General appearance : Well developed well nourished female. No acute distress HEENT: Eyes: no retinal hemorrhage or exudates,  Neck supple, trachea midline, no carotid bruits, no thyroidmegaly Lungs: Clear to auscultation, no rhonchi or wheezes, or rib retractions  Heart: Regular rate and rhythm, no murmurs or gallops Breast:Examined in sitting and supine position were symmetrical in appearance, no palpable masses or tenderness,  no skin retraction, no nipple inversion, no nipple discharge, no skin discoloration, no axillary or supraclavicular lymphadenopathy Abdomen: no palpable masses or tenderness, no rebound or guarding Extremities: no edema or skin discoloration or tenderness  Pelvic: Vulva: Normal             Vagina: No gross lesions or discharge  Cervix: No gross lesions or discharge.  Pap reflex done.  Uterus  AV, normal size, shape and consistency, non-tender and mobile  Adnexa  Without masses or  tenderness  Anus: Normal   Assessment/Plan:  48 y.o. female for annual exam   1. Encounter for routine gynecological examination with Papanicolaou smear of cervix Normal gynecologic exam.  Pap test January 2019 showed ASCUS with negative high-risk HPV, Pap reflex done today.  Breast exam normal.  Patient is scheduled for a screening mammogram.  Health labs with Dr. Talmage Nap, who follows her for type 2 diabetes mellitus.  2. ASCUS of cervix with negative high risk HPV Pap reflex done today.  3. S/P tubal ligation  4. Menopause present Menopause with no  postmenopausal bleeding.  Will stop Junel 1/20 now.  Patient will follow-up with me if develops menopausal symptoms off of the birth control pills.  5. Osteopenia of lumbar spine Bone density showing osteopenia at the spine with a T score of -1.8.  Has been on Fosamax for about 3 years, will stop now.  We will repeat a bone density in April 2021.  Continue with vitamin D supplements, calcium intake of 1.5 g/day total and regular weightbearing physical activities.  6. Class 1 obesity due to excess calories without serious comorbidity with body mass index (BMI) of 33.0 to 33.9 in adult Recommend lower calorie/carb diet such as [employer].  Aerobic physical activities 5 times a week with weightlifting every 2 days.  Dr Genia Del  3:03 pm on 02/21/2019

## 2019-02-22 DIAGNOSIS — Z01419 Encounter for gynecological examination (general) (routine) without abnormal findings: Secondary | ICD-10-CM | POA: Diagnosis not present

## 2019-02-22 DIAGNOSIS — R87610 Atypical squamous cells of undetermined significance on cytologic smear of cervix (ASC-US): Secondary | ICD-10-CM | POA: Diagnosis not present

## 2019-02-28 LAB — PAP IG W/ RFLX HPV ASCU

## 2019-02-28 LAB — HUMAN PAPILLOMAVIRUS, HIGH RISK: HPV DNA High Risk: NOT DETECTED

## 2019-03-15 ENCOUNTER — Ambulatory Visit (INDEPENDENT_AMBULATORY_CARE_PROVIDER_SITE_OTHER): Payer: 59 | Admitting: Registered Nurse

## 2019-03-15 ENCOUNTER — Encounter: Payer: Self-pay | Admitting: Registered Nurse

## 2019-03-15 ENCOUNTER — Other Ambulatory Visit: Payer: Self-pay

## 2019-03-15 VITALS — BP 118/70 | HR 113 | Temp 98.0°F | Resp 16 | Ht 62.6 in | Wt 186.0 lb

## 2019-03-15 DIAGNOSIS — R109 Unspecified abdominal pain: Secondary | ICD-10-CM

## 2019-03-15 LAB — POCT URINALYSIS DIP (CLINITEK)
Bilirubin, UA: NEGATIVE
Glucose, UA: NEGATIVE mg/dL
Ketones, POC UA: NEGATIVE mg/dL
Leukocytes, UA: NEGATIVE
Nitrite, UA: NEGATIVE
POC PROTEIN,UA: NEGATIVE
Spec Grav, UA: <=1.005 — AB (ref 1.010–1.025)
Urobilinogen, UA: 0.2 E.U./dL
pH, UA: 5.5 (ref 5.0–8.0)

## 2019-03-15 MED ORDER — NITROFURANTOIN MONOHYD MACRO 100 MG PO CAPS: 100.0000 mg | ORAL_CAPSULE | Freq: Two times a day (BID) | ORAL | 0 refills | Status: DC

## 2019-03-15 NOTE — Patient Instructions (Signed)
° ° ° °  If you have lab work done today you will be contacted with your lab results within the next 2 weeks.  If you have not heard from us then please contact us. The fastest way to get your results is to register for My Chart. ° ° °IF you received an x-ray today, you will receive an invoice from [facility]. Please contact [facility] at [phone] with questions or concerns regarding your invoice.  ° °IF you received labwork today, you will receive an invoice from [facility]. Please contact [facility] at [phone] with questions or concerns regarding your invoice.  ° °Our billing staff will not be able to assist you with questions regarding bills from these companies. ° °You will be contacted with the lab results as soon as they are available. The fastest way to get your results is to activate your My Chart account. Instructions are located on the last page of this paperwork. If you have not heard from us regarding the results in 2 weeks, please contact this office. °  ° ° ° °

## 2019-03-15 NOTE — Progress Notes (Signed)
Acute Office Visit  Subjective:    Patient ID: Karen Peterson, female    DOB: [birth year], 48 y.o.   MRN: [id number]  Chief Complaint  Patient presents with  . Back Pain    pt states she has been having pain x 1 week. Pt states the pain starts in the abdonimal area more on the lefy side.     HPI Patient is in today for 1 week of abdomen, flank, and back pain. She states it feels "deep". It was worst at onset, and despite lessening to some extent, is still moderate pain. She has been going to work and it has not interfered with ADLs. She denies urinary symptoms, though she is diabetic and reports that this gives her urinary frequency at baseline.  She has a hx significant for complicated UTI. She does not remember what these symptoms felt like, or if they are similar to the symptoms she is experiencing now. She does not remember a precipitating event to warrant muscular etiology for discomfort. She reports she does not regularly drink alcohol.   Past Medical History:  Diagnosis Date  . Decreased bone mass   . Diabetes mellitus    TYPE II  . High cholesterol   . Hyperprolactinemia (HCC)   . Insomnia   . Osteoporosis 02/2018   2016 T score -2.5 2019 T score -1.8 on Fosamax  . Premature ovarian failure   . Vitamin D deficiency     Past Surgical History:  Procedure Laterality Date  . CHOLECYSTECTOMY    . TUBAL LIGATION     PPTL    Family History  Problem Relation Age of Onset  . Hypertension Father   . Diabetes Father   . Heart disease Father   . Cancer Father        STOMACH     Social History   Socioeconomic History  . Marital status: Single    Spouse name: Not on file  . Number of children: Not on file  . Years of education: Not on file  . Highest education level: Not on file  Occupational History  . Not on file  Social Needs  . Financial resource strain: Not on file  . Food insecurity:    Worry: Not on file    Inability: Not on file  . Transportation  needs:    Medical: Not on file    Non-medical: Not on file  Tobacco Use  . Smoking status: Never Smoker  . Smokeless tobacco: Never Used  Substance and Sexual Activity  . Alcohol use: No    Alcohol/week: 0.0 standard drinks  . Drug use: No  . Sexual activity: Yes    Partners: Male    Birth control/protection: None  Lifestyle  . Physical activity:    Days per week: Not on file    Minutes per session: Not on file  . Stress: Not on file  Relationships  . Social connections:    Talks on phone: Not on file    Gets together: Not on file    Attends religious service: Not on file    Active member of club or organization: Not on file    Attends meetings of clubs or organizations: Not on file    Relationship status: Not on file  . Intimate partner violence:    Fear of current or ex partner: Not on file    Emotionally abused: Not on file    Physically abused: Not on file    Forced sexual  activity: Not on file  Other Topics Concern  . Not on file  Social History Narrative  . Not on file    Outpatient Medications Prior to Visit  Medication Sig Dispense Refill  . alendronate (FOSAMAX) 70 MG tablet Take 1 tablet (70 mg total) by mouth every 7 (seven) days. Take with a full glass of water on an empty stomach. 12 tablet 4  . ALPRAZolam (XANAX) 0.25 MG tablet Take 0.25 mg by mouth at bedtime as needed for anxiety.    . Ascorbic Acid (VITAMIN C) 100 MG tablet Take 100 mg by mouth daily.    Marland Kitchen atorvastatin (LIPITOR) 80 MG tablet Take 80 mg by mouth daily.    Marland Kitchen glimepiride (AMARYL) 2 MG tablet Take 2 mg by mouth daily with breakfast.    . KOMBIGLYZE XR 2.03-999 MG TB24   3  . Multiple Vitamin (MULTIVITAMIN) tablet Take 1 tablet by mouth daily.    . norethindrone-ethinyl estradiol (MICROGESTIN,JUNEL,LOESTRIN) 1-20 MG-MCG tablet Take 1 tablet by mouth daily. 3 Package 5  . simvastatin (ZOCOR) 80 MG tablet Take 80 mg by mouth daily.     No facility-administered medications prior to visit.      No Known Allergies  Review of Systems  Constitutional: Negative for chills, fever, malaise/fatigue and weight loss.  Respiratory: Negative for cough, shortness of breath and wheezing.   Cardiovascular: Negative for chest pain and palpitations.  Gastrointestinal: Positive for abdominal pain (LUQ, extends to flank). Negative for nausea and vomiting.  Genitourinary: Positive for flank pain (L side, extends to back. R abdomen palpation causes referred pain) and frequency. Negative for dysuria, hematuria (positive on dip) and urgency.  Skin: Negative for itching and rash.       Objective:    Physical Exam  Constitutional: She is oriented to person, place, and time. She appears well-developed and well-nourished. No distress.  Cardiovascular: Normal heart sounds.  Pulmonary/Chest: Breath sounds normal. No respiratory distress. She has no wheezes.  Abdominal: Soft. Bowel sounds are normal. She exhibits no distension and no mass. There is abdominal tenderness (LUQ, some guarding. RUQ palpation elicits referred discomfort to LUQ and back.). There is guarding (LUQ). There is no rebound.  CVA tenderness  Musculoskeletal:     Comments: Normal ROM for back extension, flexion, rotation.  Neurological: She is alert and oriented to person, place, and time.  Skin: Skin is warm and dry. She is not diaphoretic.  Psychiatric: She has a normal mood and affect. Her behavior is normal. Thought content normal.    BP 118/70   Pulse (!) 113   Temp 98 F (36.7 C) (Oral)   Resp 16   Ht 5' 2.6" (1.59 m)   Wt 186 lb (84.4 kg)   LMP 10/19/2016   SpO2 98%   BMI 33.37 kg/m  Wt Readings from Last 3 Encounters:  03/15/19 186 lb (84.4 kg)  02/21/19 182 lb (82.6 kg)  12/23/17 181 lb (82.1 kg)    Health Maintenance Due  Topic Date Due  . PNEUMOCOCCAL POLYSACCHARIDE VACCINE AGE 54-64 HIGH RISK  11/14/1972  . FOOT EXAM  11/14/1980  . OPHTHALMOLOGY EXAM  11/14/1980  . URINE MICROALBUMIN  11/14/1980  . HIV  Screening  11/14/1985  . HEMOGLOBIN A1C  08/04/2008    There are no preventive care reminders to display for this patient.   No results found for: TSH Lab Results  Component Value Date   WBC 8.6 11/10/2016   HGB 12.8 11/10/2016   HCT 37.2 (A)  11/10/2016   MCV 82.9 11/10/2016   Lab Results  Component Value Date   NA 141 11/10/2016   K 4.6 11/10/2016   CO2 24 11/10/2016   GLUCOSE 158 (H) 11/10/2016   BUN 7 11/10/2016   CREATININE 0.62 11/10/2016   BILITOT 0.3 11/10/2016   ALKPHOS 63 11/10/2016   AST 17 11/10/2016   ALT 21 11/10/2016   PROT 7.5 11/10/2016   ALBUMIN 4.0 11/10/2016   CALCIUM 9.1 11/10/2016   No results found for: CHOL No results found for: HDL No results found for: LDLCALC No results found for: TRIG No results found for: CHOLHDL No results found for: ZOXW9U     Assessment & Plan:   Problem List Items Addressed This Visit    None    Visit Diagnoses    Flank pain    -  Primary   Relevant Orders   POCT URINALYSIS DIP (CLINITEK)       Meds ordered this encounter  Medications  . nitrofurantoin, macrocrystal-monohydrate, (MACROBID) 100 MG capsule    Sig: Take 1 capsule (100 mg total) by mouth 2 (two) times daily.    Dispense:  14 capsule    Refill:  0    Order Specific Question:   Supervising Provider    Answer:   Collie Siad A [id number]     Hematuria on POCT dip, otherwise wnl. Sent for urine C+S to confirm UTI dx.   Encouraged patient to finish course of macrobid even if she feels better.  Encouraged use of AZO to relieve any discomfort.  Encouraged following nonpharm:  Push clear liquids, mostly water.  Wear loose fitting cotton clothing when possible  Empty bladder following sexual intercourse  Wipe front to back after using restroom   Encouraged patient to contact clinic if symptoms worsen or fail to improve. Contact clinic with any other questions, comments, or concerns.   Thank you for your visit today,  Thank you for  your visit with Primary Care at [facility] today.  Janeece Agee, NP [facility] Primary Care at [facility] [address] [place], [place] [address] [phone] - [phone]

## 2019-03-21 ENCOUNTER — Ambulatory Visit: Payer: 59 | Admitting: Obstetrics & Gynecology

## 2019-03-23 ENCOUNTER — Ambulatory Visit: Payer: 59

## 2019-03-24 ENCOUNTER — Ambulatory Visit: Payer: 59 | Admitting: Obstetrics & Gynecology

## 2019-03-28 ENCOUNTER — Encounter: Payer: Self-pay | Admitting: Obstetrics & Gynecology

## 2019-03-28 ENCOUNTER — Ambulatory Visit: Payer: 59 | Admitting: Obstetrics & Gynecology

## 2019-03-28 ENCOUNTER — Other Ambulatory Visit: Payer: Self-pay

## 2019-03-28 VITALS — BP 124/76

## 2019-03-28 DIAGNOSIS — R87610 Atypical squamous cells of undetermined significance on cytologic smear of cervix (ASC-US): Secondary | ICD-10-CM | POA: Diagnosis not present

## 2019-03-28 DIAGNOSIS — N72 Inflammatory disease of cervix uteri: Secondary | ICD-10-CM | POA: Diagnosis not present

## 2019-03-28 NOTE — Progress Notes (Signed)
    Karen Peterson 1971/08/23 [id number]        48 y.o.  [id number]  Married  RP: ASCUS x 3 for Colposcopy  HPI: ASCUS in 2017, 2019 and 02/2019.  HPV HR negative.  Postmenopausal on no HRT.  No PMB.  No pelvic pain.  Normal vaginal secretions.   OB History  Gravida Para Term Preterm AB Living  4 4 4     4   SAB TAB Ectopic Multiple Live Births          4    # Outcome Date GA Lbr Len/2nd Weight Sex Delivery Anes PTL Lv  4 Term     F Vag-Spont  N LIV  3 Term     F Vag-Spont  N LIV  2 Term     M Vag-Spont  N LIV  1 Term     F Vag-Spont  N LIV    Past medical history,surgical history, problem list, medications, allergies, family history and social history were all reviewed and documented in the EPIC chart.   Directed ROS with pertinent positives and negatives documented in the history of present illness/assessment and plan.  Exam:  Vitals:   03/28/19 1204  BP: 124/76   General appearance:  Normal  Colposcopy Procedure Note Itzabella L Sicard 03/28/2019  Indications:  ASCUS x 3.  HPV HR negative  Procedure Details  The risks and benefits of the procedure and Verbal informed consent obtained.  Speculum placed in vagina and excellent visualization of cervix achieved, cervix swabbed x 3 with acetic acid solution.  Findings:  Cervix colposcopy: Physical Exam Genitourinary:       Vaginal colposcopy: Normal  Vulvar colposcopy: Normal  Perirectal colposcopy: Normal  The cervix was sprayed with Hurricane before performing the cervical biopsies.  Specimens: Cervical Bx at 12 and 8 O'Clock.  Complications:  None, silver nitrate applied . Plan:  Per Cervical Bx results   Assessment/Plan:  48 y.o. G4P4004   1. ASCUS of cervix with negative high risk HPV ASCUS x3 with negative high-risk HPV each time.  Abnormal Pap test signification discussed with patient.  Reassured about having high risk HPV negative.  Colposcopy procedure explained.  Colposcopy finding reviewed  with patient.  Management per cervical biopsy results.  Other orders - Pathology Report ([facility])  Genia Del MD, 12:19 PM 03/28/2019

## 2019-03-29 DIAGNOSIS — R87610 Atypical squamous cells of undetermined significance on cytologic smear of cervix (ASC-US): Secondary | ICD-10-CM | POA: Diagnosis not present

## 2019-03-31 ENCOUNTER — Encounter: Payer: Self-pay | Admitting: Obstetrics & Gynecology

## 2019-03-31 NOTE — Patient Instructions (Signed)
1. ASCUS of cervix with negative high risk HPV ASCUS x3 with negative high-risk HPV each time.  Abnormal Pap test signification discussed with patient.  Reassured about having high risk HPV negative.  Colposcopy procedure explained.  Colposcopy finding reviewed with patient.  Management per cervical biopsy results.  Other orders - Pathology Report ([facility])  Jacquie, it was a pleasure seeing you today!  I will inform you of your results as soon as they are available.

## 2019-04-03 LAB — PATHOLOGY REPORT

## 2019-04-03 LAB — TISSUE PATH REPORT

## 2019-04-28 ENCOUNTER — Ambulatory Visit
Admission: RE | Admit: 2019-04-28 | Discharge: 2019-04-28 | Disposition: A | Discharge status: Home / Self Care | Payer: 59 | Source: Ambulatory Visit | Attending: Endocrinology | Admitting: Endocrinology

## 2019-04-28 ENCOUNTER — Other Ambulatory Visit: Payer: Self-pay

## 2019-04-28 DIAGNOSIS — Z1231 Encounter for screening mammogram for malignant neoplasm of breast: Secondary | ICD-10-CM

## 2019-05-01 ENCOUNTER — Other Ambulatory Visit: Payer: Self-pay | Admitting: Endocrinology

## 2019-05-01 DIAGNOSIS — R928 Other abnormal and inconclusive findings on diagnostic imaging of breast: Secondary | ICD-10-CM

## 2019-05-05 ENCOUNTER — Ambulatory Visit
Admission: RE | Admit: 2019-05-05 | Discharge: 2019-05-05 | Disposition: A | Discharge status: Home / Self Care | Payer: 59 | Source: Ambulatory Visit | Attending: Endocrinology | Admitting: Endocrinology

## 2019-05-05 ENCOUNTER — Other Ambulatory Visit: Payer: Self-pay

## 2019-05-05 DIAGNOSIS — R928 Other abnormal and inconclusive findings on diagnostic imaging of breast: Secondary | ICD-10-CM

## 2019-08-15 ENCOUNTER — Encounter: Payer: Self-pay | Admitting: Gynecology

## 2019-10-18 ENCOUNTER — Other Ambulatory Visit: Payer: Self-pay

## 2019-10-18 ENCOUNTER — Encounter: Payer: Self-pay | Admitting: Obstetrics & Gynecology

## 2019-10-18 ENCOUNTER — Ambulatory Visit: Payer: 59 | Admitting: Obstetrics & Gynecology

## 2019-10-18 VITALS — BP 136/88

## 2019-10-18 DIAGNOSIS — R87610 Atypical squamous cells of undetermined significance on cytologic smear of cervix (ASC-US): Secondary | ICD-10-CM | POA: Diagnosis not present

## 2019-10-18 NOTE — Progress Notes (Signed)
    Karen Peterson [birth year] [id number]        47 y.o.  [id number]   RP: Repeat 6 month Pap test  HPI:  ASCUS with negative high-risk HPV in 2019 and in April 2020.  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    OB History  Gravida Para Term Preterm AB Living  4 4 4     4   SAB TAB Ectopic Multiple Live Births          4    # Outcome Date GA Lbr Len/2nd Weight Sex Delivery Anes PTL Lv  4 Term     F Vag-Spont  N LIV  3 Term     F Vag-Spont  N LIV  2 Term     M Vag-Spont  N LIV  1 Term     F Vag-Spont  N LIV    Past medical history,surgical history, problem list, medications, allergies, family history and social history were all reviewed and documented in the EPIC chart.   Directed ROS with pertinent positives and negatives documented in the history of present illness/assessment and plan.  Exam:  Vitals:   10/18/19 1636  BP: 136/88   General appearance:  Normal  Gynecologic exam: Vulva normal.  Speculum: Cervix and vagina normal.  Normal secretions.  No bleeding.  Pap reflex done on the cervix.   Assessment/Plan:  48 y.o. G4P4004   1. ASCUS of cervix with negative high risk HPV ASCUS with negative high-risk HPV in 2019 and in April 2020.  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  Repeat Pap reflex done today.  Patient reassured about previous results.  If Pap test is negative today, will resume annual Pap test.  Counseling on above issues and coordination of care more than 50% for 15 minutes.  Princess Bruins MD, 4:50 PM 10/18/2019

## 2019-10-18 NOTE — Patient Instructions (Signed)
1. ASCUS of cervix with negative high risk HPV ASCUS with negative high-risk HPV in 2019 and in April 2020.  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  Repeat Pap reflex done today.  Patient reassured about previous results.  If Pap test is negative today, will resume annual Pap test.  Karen Peterson, it was a pleasure seeing you today!  I will inform you of your results as soon as they are available.

## 2019-10-23 NOTE — Addendum Note (Signed)
Addended by: Thurnell Garbe A on: 10/23/2019 09:10 AM   Modules accepted: Orders

## 2019-10-25 LAB — PAP IG W/ RFLX HPV ASCU

## 2019-11-20 ENCOUNTER — Other Ambulatory Visit: Payer: Self-pay

## 2019-11-20 ENCOUNTER — Telehealth (INDEPENDENT_AMBULATORY_CARE_PROVIDER_SITE_OTHER): Payer: 59 | Admitting: Emergency Medicine

## 2019-11-20 ENCOUNTER — Encounter: Payer: Self-pay | Admitting: Emergency Medicine

## 2019-11-20 VITALS — Ht 62.0 in | Wt 178.0 lb

## 2019-11-20 DIAGNOSIS — R059 Cough, unspecified: Secondary | ICD-10-CM

## 2019-11-20 DIAGNOSIS — J22 Unspecified acute lower respiratory infection: Secondary | ICD-10-CM

## 2019-11-20 DIAGNOSIS — R05 Cough: Secondary | ICD-10-CM | POA: Diagnosis not present

## 2019-11-20 MED ORDER — PREDNISONE 20 MG PO TABS: 20.0000 mg | ORAL_TABLET | Freq: Every day | ORAL | 0 refills | Status: AC

## 2019-11-20 MED ORDER — AZITHROMYCIN 250 MG PO TABS: ORAL_TABLET | ORAL | 0 refills | Status: DC

## 2019-11-20 MED ORDER — HYDROCODONE-HOMATROPINE 5-1.5 MG/5ML PO SYRP: 5.0000 mL | ORAL_SOLUTION | Freq: Every evening | ORAL | 0 refills | Status: DC | PRN

## 2019-11-20 NOTE — Progress Notes (Signed)
Telemedicine Encounter- SOAP NOTE Established Patient  This telephone encounter was conducted with the patient's (or proxy's) verbal consent via audio telecommunications: yes/no: Yes Patient was instructed to have this encounter in a suitably private space; and to only have persons present to whom they give permission to participate. In addition, patient identity was confirmed by use of name plus two identifiers (DOB and address).  I discussed the limitations, risks, security and privacy concerns of performing an evaluation and management service by telephone and the availability of in person appointments. I also discussed with the patient that there may be a patient responsible charge related to this service. The patient expressed understanding and agreed to proceed.  I spent a total of TIME; 0 MIN TO 60 MIN: 15 minutes talking with the patient or their proxy.  Chief Complaint  Patient presents with  . Nasal Congestion    green mucus from the nose and headach - per pt started Saturday  . Cough    nonproductive - hurts in the chest to cough    Subjective   Karen Peterson is a 48 y.o. female established patient. Telephone visit today complaining of sudden onset flulike symptoms 2 days ago with sneezing, congestion, dry cough, headache and pleuritic chest pain.  Denies fever or chills.  Denies difficulty breathing or wheezing.  No one else sick at home.  No other significant symptoms.  HPI   Patient Active Problem List   Diagnosis Date Noted  . Premature ovarian failure 09/28/2016  . Dysuria 07/20/2016  . Low bone mass 08/13/2015  . Hyperprolactinemia ([place]) 04/10/2013  . Type II diabetes mellitus ([place]) 03/24/2012  . Hypercholesterolemia 03/24/2012  . Premature menopause 03/24/2012  . Insomnia     Past Medical History:  Diagnosis Date  . Decreased bone mass   . Diabetes mellitus    TYPE II  . High cholesterol   . Hyperprolactinemia ([place])   . Insomnia   . Osteoporosis  02/2018   2016 T score -2.5 2019 T score -1.8 on Fosamax  . Premature ovarian failure   . Vitamin D deficiency     Current Outpatient Medications  Medication Sig Dispense Refill  . Ascorbic Acid (VITAMIN C) 100 MG tablet Take 100 mg by mouth daily.    Marland Kitchen atorvastatin (LIPITOR) 80 MG tablet Take 80 mg by mouth daily.    Marland Kitchen glimepiride (AMARYL) 2 MG tablet Take 2 mg by mouth daily with breakfast.    . KOMBIGLYZE XR 2.03-999 MG TB24   3  . Multiple Vitamin (MULTIVITAMIN) tablet Take 1 tablet by mouth daily.    Marland Kitchen alendronate (FOSAMAX) 70 MG tablet Take 1 tablet (70 mg total) by mouth every 7 (seven) days. Take with a full glass of water on an empty stomach. (Patient not taking: Reported on 11/20/2019) 12 tablet 4  . ALPRAZolam (XANAX) 0.25 MG tablet Take 0.25 mg by mouth at bedtime as needed for anxiety.    . nitrofurantoin, macrocrystal-monohydrate, (MACROBID) 100 MG capsule Take 1 capsule (100 mg total) by mouth 2 (two) times daily. (Patient not taking: Reported on 11/20/2019) 14 capsule 0  . norethindrone-ethinyl estradiol (MICROGESTIN,JUNEL,LOESTRIN) 1-20 MG-MCG tablet Take 1 tablet by mouth daily. (Patient not taking: Reported on 11/20/2019) 3 Package 5  . simvastatin (ZOCOR) 80 MG tablet Take 80 mg by mouth daily.     No current facility-administered medications for this visit.    No Known Allergies  Social History   Socioeconomic History  . Marital status: Single  Spouse name: Not on file  . Number of children: Not on file  . Years of education: Not on file  . Highest education level: Not on file  Occupational History  . Not on file  Tobacco Use  . Smoking status: Never Smoker  . Smokeless tobacco: Never Used  Substance and Sexual Activity  . Alcohol use: No    Alcohol/week: 0.0 standard drinks  . Drug use: No  . Sexual activity: Yes    Partners: Male    Birth control/protection: None  Other Topics Concern  . Not on file  Social History Narrative  . Not on file    Social Determinants of Health   Financial Resource Strain:   . Difficulty of Paying Living Expenses: Not on file  Food Insecurity:   . Worried About Programme researcher, broadcasting/film/videounning Out of Food in the Last Year: Not on file  . Ran Out of Food in the Last Year: Not on file  Transportation Needs:   . Lack of Transportation (Medical): Not on file  . Lack of Transportation (Non-Medical): Not on file  Physical Activity:   . Days of Exercise per Week: Not on file  . Minutes of Exercise per Session: Not on file  Stress:   . Feeling of Stress : Not on file  Social Connections:   . Frequency of Communication with Friends and Family: Not on file  . Frequency of Social Gatherings with Friends and Family: Not on file  . Attends Religious Services: Not on file  . Active Member of Clubs or Organizations: Not on file  . Attends BankerClub or Organization Meetings: Not on file  . Marital Status: Not on file  Intimate Partner Violence:   . Fear of Current or Ex-Partner: Not on file  . Emotionally Abused: Not on file  . Physically Abused: Not on file  . Sexually Abused: Not on file    Review of Systems  Constitutional: Negative.  Negative for chills and fever.  HENT: Positive for congestion. Negative for sore throat.   Respiratory: Positive for cough. Negative for shortness of breath.   Cardiovascular: Negative.  Negative for chest pain and palpitations.  Gastrointestinal: Negative for abdominal pain, diarrhea, nausea and vomiting.  Musculoskeletal: Negative.  Negative for myalgias.  Skin: Negative.  Negative for rash.  Neurological: Positive for headaches.  All other systems reviewed and are negative.   Objective  Alert and oriented x3 in no apparent respiratory distress. Vitals as reported by the patient: Today's Vitals   11/20/19 1422  Weight: 178 lb (80.7 kg)  Height: 5\' 2"  (1.575 m)    There are no diagnoses linked to this encounter. Karen Peterson was seen today for nasal congestion and cough.  Diagnoses and  all orders for this visit:  Cough -     predniSONE (DELTASONE) 20 MG tablet; Take 1 tablet (20 mg total) by mouth daily with breakfast for 5 days. -     HYDROcodone-homatropine (HYCODAN) 5-1.5 MG/5ML syrup; Take 5 mLs by mouth at bedtime as needed for cough.  Lower respiratory infection -     azithromycin (ZITHROMAX) 250 MG tablet; Sig as indicated    Clinically stable.  No red flag signs or symptoms.  Covid precautions given.  ED precautions given.  Advised to call the office if no better later this week. I discussed the assessment and treatment plan with the patient. The patient was provided an opportunity to ask questions and all were answered. The patient agreed with the plan and demonstrated an understanding  of the instructions.   The patient was advised to call back or seek an in-person evaluation if the symptoms worsen or if the condition fails to improve as anticipated.  I provided 15 minutes of non-face-to-face time during this encounter.  Georgina Quint, MD  Primary Care at [facility]

## 2020-01-31 ENCOUNTER — Other Ambulatory Visit: Payer: Self-pay | Admitting: Registered Nurse

## 2020-01-31 DIAGNOSIS — Z1231 Encounter for screening mammogram for malignant neoplasm of breast: Secondary | ICD-10-CM

## 2020-04-30 DIAGNOSIS — Z0289 Encounter for other administrative examinations: Secondary | ICD-10-CM

## 2020-05-06 ENCOUNTER — Ambulatory Visit
Admission: RE | Admit: 2020-05-06 | Discharge: 2020-05-06 | Disposition: A | Discharge status: Home / Self Care | Payer: 59 | Source: Ambulatory Visit | Attending: Registered Nurse | Admitting: Registered Nurse

## 2020-05-06 ENCOUNTER — Other Ambulatory Visit: Payer: Self-pay

## 2020-05-06 ENCOUNTER — Ambulatory Visit: Payer: 59

## 2020-05-06 DIAGNOSIS — Z1231 Encounter for screening mammogram for malignant neoplasm of breast: Secondary | ICD-10-CM

## 2020-05-09 ENCOUNTER — Other Ambulatory Visit: Payer: Self-pay | Admitting: Registered Nurse

## 2020-05-09 DIAGNOSIS — R928 Other abnormal and inconclusive findings on diagnostic imaging of breast: Secondary | ICD-10-CM

## 2020-05-22 ENCOUNTER — Ambulatory Visit
Admission: RE | Admit: 2020-05-22 | Discharge: 2020-05-22 | Disposition: A | Discharge status: Home / Self Care | Payer: 59 | Source: Ambulatory Visit | Attending: Registered Nurse | Admitting: Registered Nurse

## 2020-05-22 ENCOUNTER — Other Ambulatory Visit: Payer: Self-pay

## 2020-05-22 DIAGNOSIS — R928 Other abnormal and inconclusive findings on diagnostic imaging of breast: Secondary | ICD-10-CM

## 2020-08-25 ENCOUNTER — Encounter: Payer: Self-pay | Admitting: Family Medicine

## 2020-08-25 ENCOUNTER — Ambulatory Visit
Admission: EM | Admit: 2020-08-25 | Discharge: 2020-08-25 | Disposition: A | Discharge status: Home / Self Care | Payer: 59 | Attending: Family Medicine | Admitting: Family Medicine

## 2020-08-25 ENCOUNTER — Other Ambulatory Visit: Payer: Self-pay

## 2020-08-25 DIAGNOSIS — R300 Dysuria: Secondary | ICD-10-CM | POA: Insufficient documentation

## 2020-08-25 LAB — POCT URINALYSIS DIP (MANUAL ENTRY)
Glucose, UA: NEGATIVE mg/dL
Ketones, POC UA: NEGATIVE mg/dL
Nitrite, UA: NEGATIVE
Spec Grav, UA: 1.02 (ref 1.010–1.025)
Urobilinogen, UA: 0.2 E.U./dL
pH, UA: 6 (ref 5.0–8.0)

## 2020-08-25 MED ORDER — FLUCONAZOLE 150 MG PO TABS: 150.0000 mg | ORAL_TABLET | Freq: Once | ORAL | 0 refills | Status: AC

## 2020-08-25 MED ORDER — CEPHALEXIN 500 MG PO CAPS: 500.0000 mg | ORAL_CAPSULE | Freq: Three times a day (TID) | ORAL | 0 refills | Status: DC

## 2020-08-25 NOTE — ED Provider Notes (Signed)
[facility]    CSN: [id number] Arrival date & time: 08/25/20  1152      History   Chief Complaint No chief complaint on file.   HPI Karen Peterson is a 49 y.o. female.   Initial [facility] patient visit  Patient is complaining of dysuria.  Symptoms began 1 week ago but seemed to improve after couple days of cranberry juice.  Symptoms became more pronounced yesterday and this morning, in addition to dysuria, she noticed some blood on tissue and has nausea and bloating.     Past Medical History:  Diagnosis Date  . Decreased bone mass   . Diabetes mellitus    TYPE II  . High cholesterol   . Hyperprolactinemia (HCC)   . Insomnia   . Osteoporosis 02/2018   2016 T score -2.5 2019 T score -1.8 on Fosamax  . Premature ovarian failure   . Vitamin D deficiency     Patient Active Problem List   Diagnosis Date Noted  . Premature ovarian failure 09/28/2016  . Dysuria 07/20/2016  . Low bone mass 08/13/2015  . Hyperprolactinemia (HCC) 04/10/2013  . Type II diabetes mellitus (HCC) 03/24/2012  . Hypercholesterolemia 03/24/2012  . Premature menopause 03/24/2012  . Insomnia     Past Surgical History:  Procedure Laterality Date  . CHOLECYSTECTOMY    . TUBAL LIGATION     PPTL    OB History    Gravida  4   Para  4   Term  4   Preterm      AB      Living  4     SAB      TAB      Ectopic      Multiple      Live Births  4            Home Medications    Prior to Admission medications   Medication Sig Start Date End Date Taking? Authorizing Provider  ALPRAZolam Prudy Feeler) 0.25 MG tablet Take 0.25 mg by mouth at bedtime as needed for anxiety.    [provider]  Ascorbic Acid (VITAMIN C) 100 MG tablet Take 100 mg by mouth daily.    [provider]  atorvastatin (LIPITOR) 80 MG tablet Take 80 mg by mouth daily. 03/03/19   [provider]  cephALEXin (KEFLEX) 500 MG capsule Take 1 capsule (500 mg total) by mouth 3 (three)  times daily. 08/25/20   Elvina Sidle, MD  fluconazole (DIFLUCAN) 150 MG tablet Take 1 tablet (150 mg total) by mouth once for 1 dose. Repeat if needed 08/25/20 08/25/20  Elvina Sidle, MD  glimepiride (AMARYL) 2 MG tablet Take 2 mg by mouth daily with breakfast.    [provider]  Multiple Vitamin (MULTIVITAMIN) tablet Take 1 tablet by mouth daily.    [provider]  simvastatin (ZOCOR) 80 MG tablet Take 80 mg by mouth daily.    [provider]  norethindrone-ethinyl estradiol (MICROGESTIN,JUNEL,LOESTRIN) 1-20 MG-MCG tablet Take 1 tablet by mouth daily. Patient not taking: Reported on 11/20/2019 12/23/17 08/25/20  Genia Del, MD    Family History Family History  Problem Relation Age of Onset  . Hypertension Father   . Diabetes Father   . Heart disease Father   . Cancer Father        STOMACH   . Breast cancer Neg Hx     Social History Social History   Tobacco Use  . Smoking status: Never Smoker  .  Smokeless tobacco: Never Used  Vaping Use  . Vaping Use: Never used  Substance Use Topics  . Alcohol use: No    Alcohol/week: 0.0 standard drinks  . Drug use: No     Allergies   Patient has no known allergies.   Review of Systems Review of Systems  Constitutional: Positive for activity change and appetite change. Negative for chills, diaphoresis, fatigue and fever.  HENT: Negative.   Gastrointestinal: Positive for abdominal distention and nausea.  Genitourinary: Positive for dysuria and hematuria.     Physical Exam Triage Vital Signs ED Triage Vitals [08/25/20 1214]  Enc Vitals Group     BP (!) 148/92     Pulse Rate 88     Resp 20     Temp 97.9 F (36.6 C)     Temp Source Oral     SpO2 96 %     Weight      Height      Head Circumference      Peak Flow      Pain Score      Pain Loc      Pain Edu?      Excl. in GC?    No data found.  Updated Vital Signs BP (!) 148/92 (BP Location: Right Arm)   Pulse 88   Temp 97.9  F (36.6 C) (Oral)   Resp 20   LMP 10/19/2016   SpO2 96%   Physical Exam Vitals and nursing note reviewed.  Constitutional:      Appearance: Normal appearance. She is obese.  HENT:     Head: Normocephalic.     Mouth/Throat:     Pharynx: Oropharynx is clear.  Eyes:     Conjunctiva/sclera: Conjunctivae normal.  Cardiovascular:     Rate and Rhythm: Normal rate and regular rhythm.     Heart sounds: Normal heart sounds.  Pulmonary:     Effort: Pulmonary effort is normal.     Breath sounds: Normal breath sounds.  Abdominal:     General: There is distension.     Tenderness: There is abdominal tenderness.  Musculoskeletal:        General: Normal range of motion.     Cervical back: Normal range of motion and neck supple.  Skin:    General: Skin is warm and dry.  Neurological:     General: No focal deficit present.     Mental Status: She is alert and oriented to person, place, and time.  Psychiatric:        Mood and Affect: Mood normal.        Behavior: Behavior normal.        Thought Content: Thought content normal.        Judgment: Judgment normal.      UC Treatments / Results  Labs (all labs ordered are listed, but only abnormal results are displayed) Labs Reviewed  POCT URINALYSIS DIP (MANUAL ENTRY) - Abnormal; Notable for the following components:      Result Value   Clarity, UA cloudy (*)    Bilirubin, UA small (*)    Blood, UA large (*)    Protein Ur, POC trace (*)    Leukocytes, UA Large (3+) (*)    All other components within normal limits  URINE CULTURE     Procedures Procedures (including critical care time)  Medications Ordered in UC Medications - No data to display  Initial Impression / Assessment and Plan / UC Course  I have reviewed the triage  vital signs and the nursing notes.  Pertinent labs & imaging results that were available during my care of the patient were reviewed by me and considered in my medical decision making (see chart for  details).    Final Clinical Impressions(s) / UC Diagnoses   Final diagnoses:  Dysuria   Discharge Instructions   None    ED Prescriptions    Medication Sig Dispense Auth. Provider   fluconazole (DIFLUCAN) 150 MG tablet Take 1 tablet (150 mg total) by mouth once for 1 dose. Repeat if needed 2 tablet Elvina Sidle, MD   cephALEXin (KEFLEX) 500 MG capsule Take 1 capsule (500 mg total) by mouth 3 (three) times daily. 21 capsule Elvina Sidle, MD     PDMP not reviewed this encounter.   Elvina Sidle, MD 08/25/20 1239

## 2020-08-27 LAB — URINE CULTURE: Culture: <10000 — AB

## 2020-09-03 ENCOUNTER — Encounter: Payer: Self-pay | Admitting: Obstetrics & Gynecology

## 2020-09-03 ENCOUNTER — Other Ambulatory Visit: Payer: Self-pay

## 2020-09-03 ENCOUNTER — Ambulatory Visit: Payer: 59 | Admitting: Obstetrics & Gynecology

## 2020-09-03 VITALS — BP 140/86

## 2020-09-03 DIAGNOSIS — N952 Postmenopausal atrophic vaginitis: Secondary | ICD-10-CM

## 2020-09-03 DIAGNOSIS — R102 Pelvic and perineal pain: Secondary | ICD-10-CM | POA: Diagnosis not present

## 2020-09-03 NOTE — Progress Notes (Signed)
    Karen Peterson 25-Mar-1971 [id number]        49 y.o.  G4P4L4  Married.  S/P TL.  RP: Vaginal/vulvar dryness  HPI: Treated with Keflex for a possible UTI, but U. Culture Negative, just stopped Keflex.  Vaginal and vulvar dryness, which is not causing any discomfort, but patient wonders if it is normal.  Premature Ovarian Failure/menopause.  Junel 1/20 stopped x 01/2019.  No postmenopausal bleeding.  No pain with intercourse.  No UTI Sx.  BMs normal.   OB History  Gravida Para Term Preterm AB Living  4 4 4     4   SAB TAB Ectopic Multiple Live Births          4    # Outcome Date GA Lbr Len/2nd Weight Sex Delivery Anes PTL Lv  4 Term     F Vag-Spont  N LIV  3 Term     F Vag-Spont  N LIV  2 Term     M Vag-Spont  N LIV  1 Term     F Vag-Spont  N LIV    Past medical history,surgical history, problem list, medications, allergies, family history and social history were all reviewed and documented in the EPIC chart.   Directed ROS with pertinent positives and negatives documented in the history of present illness/assessment and plan.  Exam:  Vitals:   09/03/20 1431  BP: 140/86   General appearance:  Normal  Abdomen: Normal  Gynecologic exam: Vulva normal.  Vagina normal.  No vaginal d/c.  Bimanual exam:  Uterus AV, normal.  No adnexal mass, NT.  No uterine prolapse/Cystocele/Rectocele.  U/A Negative   Assessment/Plan:  49 y.o. G4P4004   1. Postmenopausal atrophic vaginitis Vulvo-vaginal dryness of postmenopause.  Normal gynecologic exam in postmenopause.  No vaginal or vulvar pain or discomfort.  Patient reassured that it is normal in postmenopause to have very little vaginal-vulvar secretions.  No need for treatment given the absence of discomfort or pain, even with IC.  Recommend Coconut oil if becomes uncomfortable with the dryness.  2. Pelvic pressure in female U/A completely negative.  Patient reassured.  Normal gynecologic exam with no pelvic floor relaxation and  no mass felt. - Urinalysis,Complete w/RFL Culture  54 MD, 2:53 PM 09/03/2020

## 2020-09-05 LAB — URINALYSIS, COMPLETE W/RFL CULTURE
Bacteria, UA: NONE SEEN /HPF
Bilirubin Urine: NEGATIVE
Glucose, UA: NEGATIVE
Hyaline Cast: NONE SEEN /LPF
Ketones, ur: NEGATIVE
Leukocyte Esterase: NEGATIVE
Nitrites, Initial: NEGATIVE
Protein, ur: NEGATIVE
Specific Gravity, Urine: 1.01 (ref 1.001–1.03)
pH: 6 (ref 5.0–8.0)

## 2020-09-05 LAB — URINE CULTURE
MICRO NUMBER:: 11061199
SPECIMEN QUALITY:: ADEQUATE

## 2020-09-05 LAB — CULTURE INDICATED

## 2020-11-13 ENCOUNTER — Encounter: Payer: Self-pay | Admitting: Obstetrics & Gynecology

## 2020-11-13 ENCOUNTER — Ambulatory Visit (INDEPENDENT_AMBULATORY_CARE_PROVIDER_SITE_OTHER): Payer: 59 | Admitting: Obstetrics & Gynecology

## 2020-11-13 ENCOUNTER — Other Ambulatory Visit: Payer: Self-pay

## 2020-11-13 VITALS — BP 126/80 | Ht 62.0 in | Wt 174.0 lb

## 2020-11-13 DIAGNOSIS — M8588 Other specified disorders of bone density and structure, other site: Secondary | ICD-10-CM | POA: Diagnosis not present

## 2020-11-13 DIAGNOSIS — Z9851 Tubal ligation status: Secondary | ICD-10-CM | POA: Diagnosis not present

## 2020-11-13 DIAGNOSIS — Z780 Asymptomatic menopausal state: Secondary | ICD-10-CM

## 2020-11-13 DIAGNOSIS — Z01419 Encounter for gynecological examination (general) (routine) without abnormal findings: Secondary | ICD-10-CM

## 2020-11-13 NOTE — Progress Notes (Signed)
Karen Peterson [facility] [birth year] [id number]   History:    49 y.o.  G4P4L4  Married.  S/P TL.  Daughter hospitalized with Ulcerative Colitis.  RP:  Established patient presenting for annual gyn exam   HPI: Premature Ovarian Failure/menopause. No postmenopausal bleeding.  Last Bone Density very good post Fosamax with only Osteopenia at the Spine, T-Score -1.8.  Taking vitamin D supplements with calcium.  No pain with intercourse.  Breast normal.  Body mass index 31.83.  Needs to exercise more.  Followed by Dr. Talmage Nap for type 2 diabetes mellitus.  Health labs with Dr. Talmage Nap.   Past medical history,surgical history, family history and social history were all reviewed and documented in the EPIC chart.  Gynecologic History Patient's last menstrual period was 10/19/2016.  Obstetric History OB History  Gravida Para Term Preterm AB Living  4 4 4     4   SAB IAB Ectopic Multiple Live Births          4    # Outcome Date GA Lbr Len/2nd Weight Sex Delivery Anes PTL Lv  4 Term     F Vag-Spont  N LIV  3 Term     F Vag-Spont  N LIV  2 Term     M Vag-Spont  N LIV  1 Term     F Vag-Spont  N LIV     ROS: A ROS was performed and pertinent positives and negatives are included in the history.  GENERAL: No fevers or chills. HEENT: No change in vision, no earache, sore throat or sinus congestion. NECK: No pain or stiffness. CARDIOVASCULAR: No chest pain or pressure. No palpitations. PULMONARY: No shortness of breath, cough or wheeze. GASTROINTESTINAL: No abdominal pain, nausea, vomiting or diarrhea, melena or bright red blood per rectum. GENITOURINARY: No urinary frequency, urgency, hesitancy or dysuria. MUSCULOSKELETAL: No joint or muscle pain, no back pain, no recent trauma. DERMATOLOGIC: No rash, no itching, no lesions. ENDOCRINE: No polyuria, polydipsia, no heat or cold intolerance. No recent change in weight. HEMATOLOGICAL: No anemia or easy bruising or bleeding. NEUROLOGIC: No headache, seizures,  numbness, tingling or weakness. PSYCHIATRIC: No depression, no loss of interest in normal activity or change in sleep pattern.     Exam:   BP 126/80 (BP Location: Right Arm, Patient Position: Sitting, Cuff Size: Normal)   Ht 5\' 2"  (1.575 m)   Wt 174 lb (78.9 kg)   LMP 10/19/2016   BMI 31.83 kg/m   Body mass index is 31.83 kg/m.  General appearance : Well developed well nourished female. No acute distress HEENT: Eyes: no retinal hemorrhage or exudates,  Neck supple, trachea midline, no carotid bruits, no thyroidmegaly Lungs: Clear to auscultation, no rhonchi or wheezes, or rib retractions  Heart: Regular rate and rhythm, no murmurs or gallops Breast:Examined in sitting and supine position were symmetrical in appearance, no palpable masses or tenderness,  no skin retraction, no nipple inversion, no nipple discharge, no skin discoloration, no axillary or supraclavicular lymphadenopathy Abdomen: no palpable masses or tenderness, no rebound or guarding Extremities: no edema or skin discoloration or tenderness  Pelvic: Vulva: Normal             Vagina: No gross lesions or discharge  Cervix: No gross lesions or discharge.  Pap reflex done.  Uterus  AV, normal size, shape and consistency, non-tender and mobile  Adnexa  Without masses or tenderness  Anus: Normal   Assessment/Plan:  48 y.o. female for annual exam   1. Encounter  for routine gynecological examination with Papanicolaou smear of cervix Normal gynecologic exam.  Pap reflex done.  Breast exam normal.  Screening mammogram June 2021 was negative.  Will do colonoscopy at age 23 next year.  Health labs with family physician.  2. S/P tubal ligation  3. Postmenopause Well on no hormone replacement therapy.  No postmenopausal bleeding.  4. Osteopenia of lumbar spine Osteopenia on bone density April 2019.  We will repeat a bone density here now.  Vitamin D supplements, calcium intake of 1500 mg daily and regular weightbearing  physical activity is recommended. - DG Bone Density; Future  Genia Del MD, 4:06 PM 11/13/2020

## 2020-11-14 NOTE — Addendum Note (Signed)
Addended by: Berna Spare A on: 11/14/2020 09:03 AM   Modules accepted: Orders

## 2020-11-20 LAB — PAP IG W/ RFLX HPV ASCU

## 2020-12-24 ENCOUNTER — Ambulatory Visit: Payer: 59 | Attending: Internal Medicine

## 2020-12-24 DIAGNOSIS — Z23 Encounter for immunization: Secondary | ICD-10-CM

## 2020-12-24 NOTE — Progress Notes (Signed)
   Covid-19 Vaccination Clinic  Name:  Karen Peterson    MRN: [id number] DOB: [birth year]  12/24/2020  [name] was observed post Covid-19 immunization for 15 minutes without incident. She was provided with Vaccine Information Sheet and instruction to access the V-Safe system.   [name] was instructed to call 911 with any severe reactions post vaccine: Marland Kitchen Difficulty breathing  . Swelling of face and throat  . A fast heartbeat  . A bad rash all over body  . Dizziness and weakness   Immunizations Administered    Name Date Dose VIS Date Route   PFIZER Comrnaty(Gray TOP) Covid-19 Vaccine 12/24/2020  3:29 PM 0.3 mL 10/31/2020 Intramuscular   Manufacturer: [employer], [employer]   Lot: [id number]   NDC: [phone]

## 2020-12-31 ENCOUNTER — Ambulatory Visit (INDEPENDENT_AMBULATORY_CARE_PROVIDER_SITE_OTHER): Payer: 59

## 2020-12-31 ENCOUNTER — Other Ambulatory Visit: Payer: Self-pay | Admitting: Obstetrics & Gynecology

## 2020-12-31 ENCOUNTER — Other Ambulatory Visit: Payer: Self-pay

## 2020-12-31 DIAGNOSIS — Z780 Asymptomatic menopausal state: Secondary | ICD-10-CM

## 2020-12-31 DIAGNOSIS — M8588 Other specified disorders of bone density and structure, other site: Secondary | ICD-10-CM

## 2021-06-27 ENCOUNTER — Other Ambulatory Visit: Payer: Self-pay | Admitting: Registered Nurse

## 2021-06-27 DIAGNOSIS — Z1231 Encounter for screening mammogram for malignant neoplasm of breast: Secondary | ICD-10-CM

## 2021-08-18 ENCOUNTER — Other Ambulatory Visit: Payer: Self-pay

## 2021-08-18 ENCOUNTER — Ambulatory Visit
Admission: RE | Admit: 2021-08-18 | Discharge: 2021-08-18 | Disposition: A | Discharge status: Home / Self Care | Payer: 59 | Source: Ambulatory Visit | Attending: Registered Nurse | Admitting: Registered Nurse

## 2021-08-18 DIAGNOSIS — Z1231 Encounter for screening mammogram for malignant neoplasm of breast: Secondary | ICD-10-CM

## 2021-10-02 IMAGING — MG MM DIGITAL SCREENING BILAT W/ TOMO AND CAD
8 series · 8 of 24 positions shown · non-contrast
Comparison: Previous exam(s).

CLINICAL DATA: Screening.

EXAM:
DIGITAL SCREENING BILATERAL MAMMOGRAM WITH TOMOSYNTHESIS AND CAD
TECHNIQUE: Bilateral screening digital craniocaudal and mediolateral oblique
mammograms were obtained. Bilateral screening digital breast
tomosynthesis was performed. The images were evaluated with
computer-aided detection.

[R MLO synth-2D]
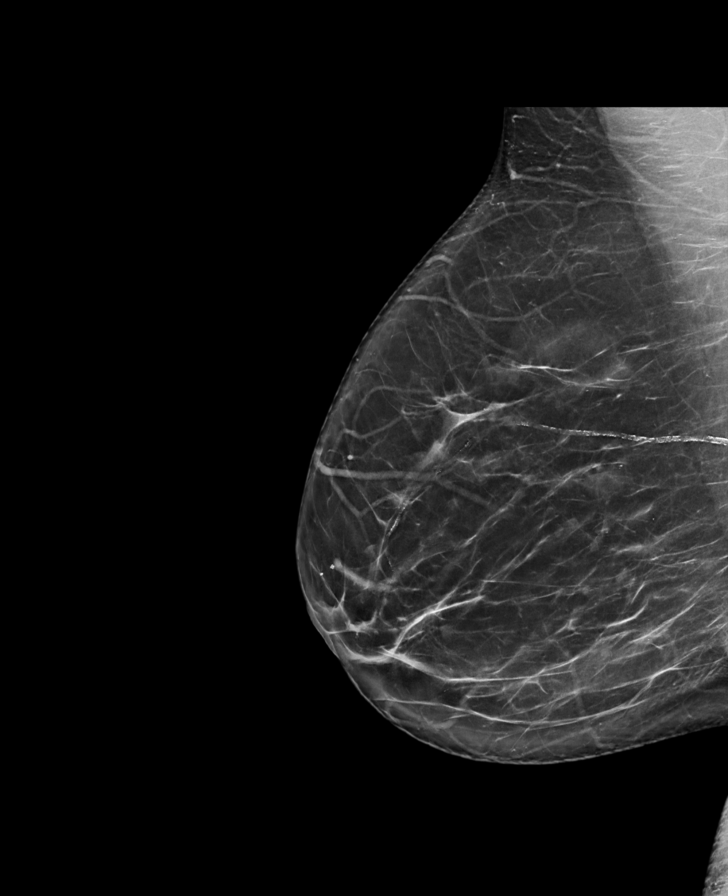

[L CC synth-2D]
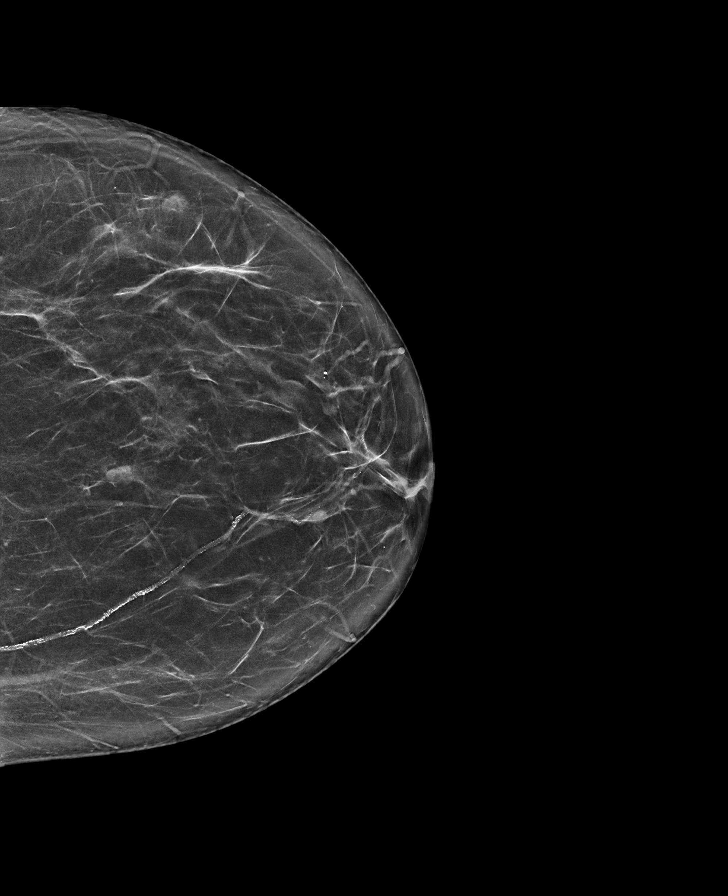

[R CC synth-2D]
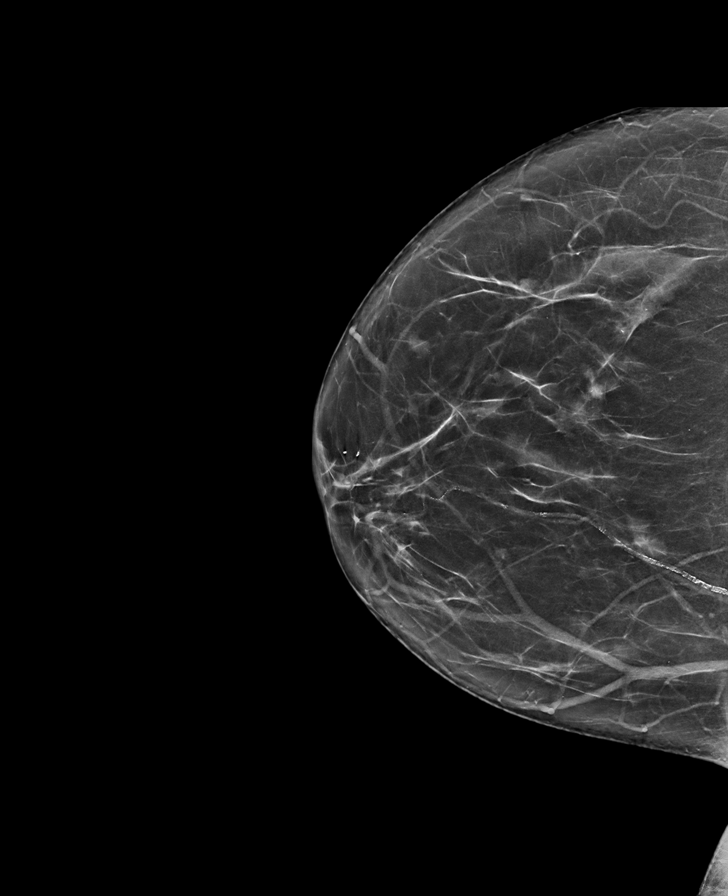

[L MLO synth-2D]
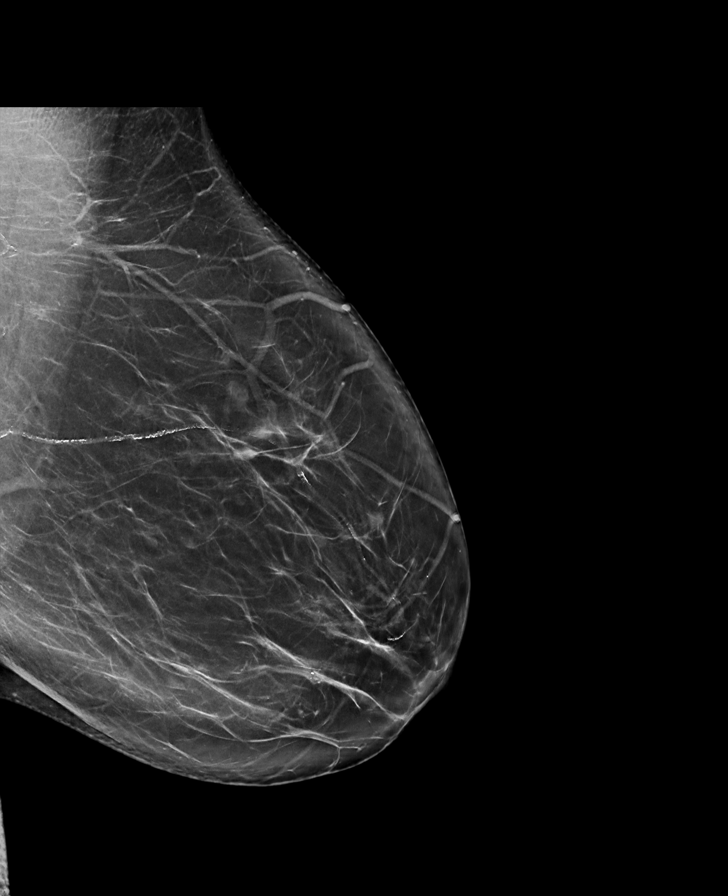

[R MLO tomo · tomo slice 43/84.0]
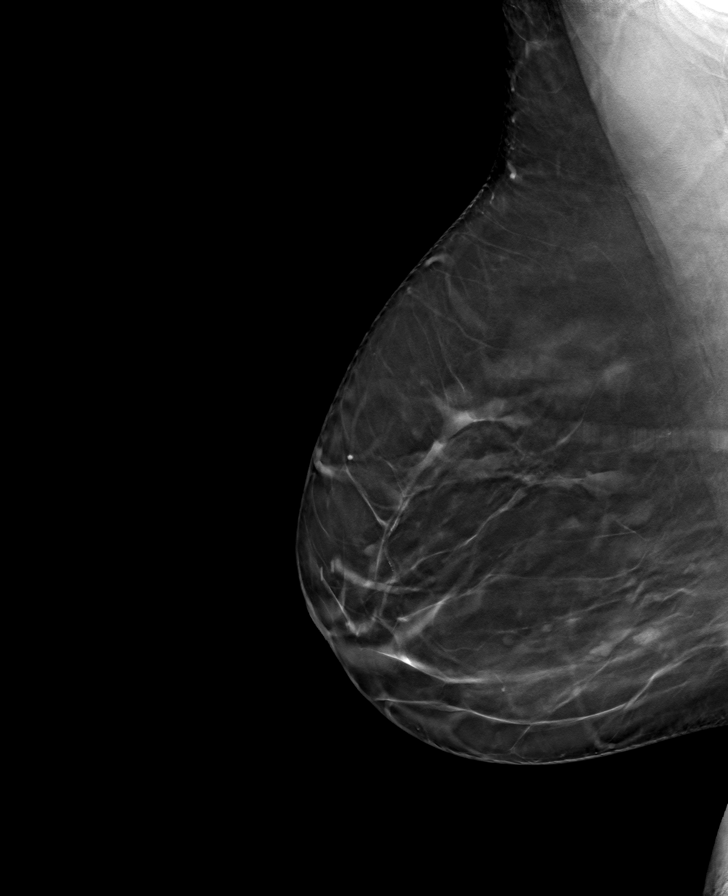

[L MLO tomo · tomo slice 45/89.0]
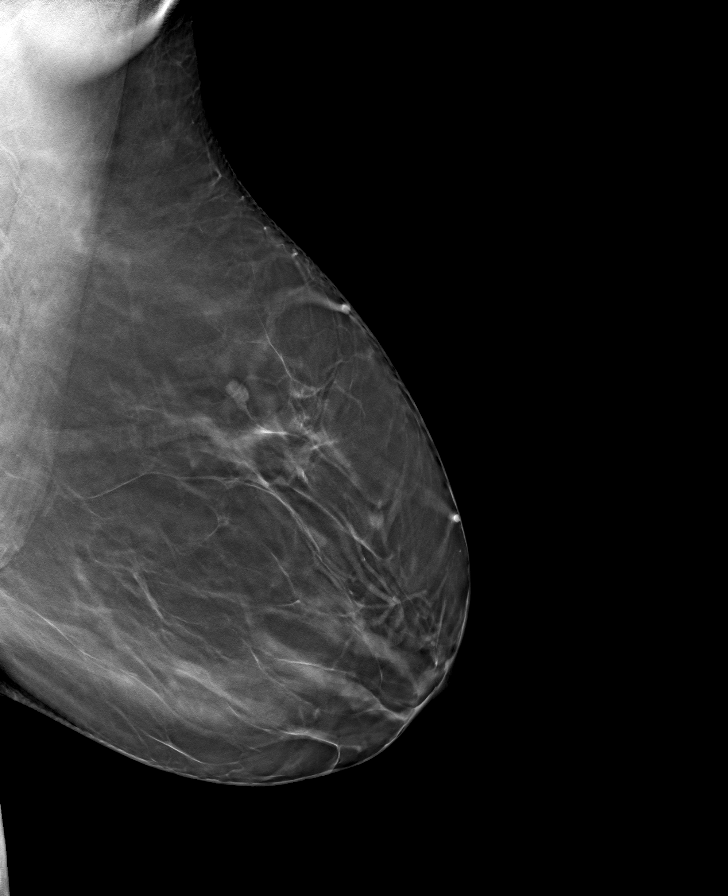

[L CC tomo · tomo slice 37/74.0]
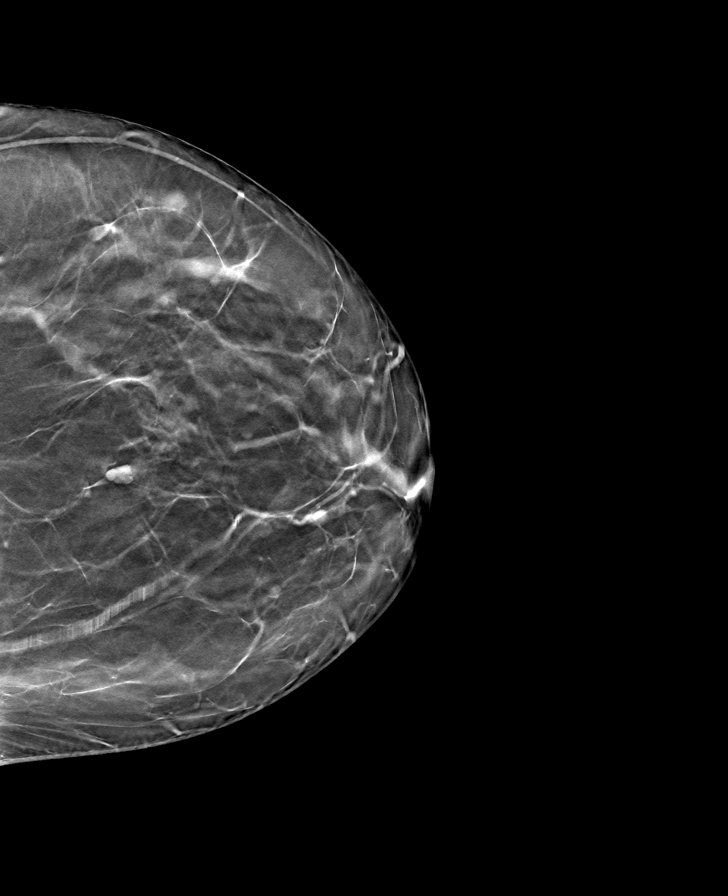

[R CC tomo · tomo slice 39/77.0]
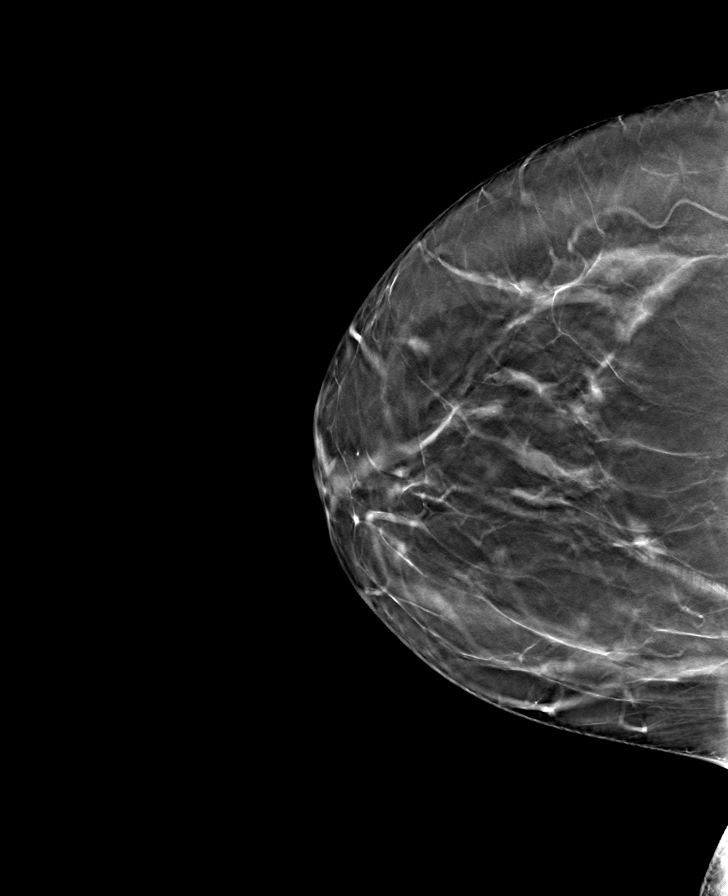

[8 of 24 positions shown; findings below may reference images not displayed]

ACR Breast Density Category b: There are scattered areas of
fibroglandular density.
FINDINGS: There are no findings suspicious for malignancy.
IMPRESSION: No mammographic evidence of malignancy. A result letter of this
screening mammogram will be mailed directly to the patient.

RECOMMENDATION:
Screening mammogram in one year. (Code:51-O-LD2)

BI-RADS CATEGORY  1: Negative.

## 2021-11-14 ENCOUNTER — Encounter: Payer: 59 | Admitting: Obstetrics & Gynecology

## 2021-11-18 ENCOUNTER — Ambulatory Visit: Payer: 59 | Admitting: Obstetrics & Gynecology

## 2021-11-25 ENCOUNTER — Other Ambulatory Visit: Payer: Self-pay

## 2021-11-25 ENCOUNTER — Other Ambulatory Visit (HOSPITAL_COMMUNITY)
Admission: RE | Admit: 2021-11-25 | Discharge: 2021-11-25 | Disposition: A | Discharge status: Home / Self Care | Payer: 59 | Source: Ambulatory Visit | Attending: Obstetrics & Gynecology | Admitting: Obstetrics & Gynecology

## 2021-11-25 ENCOUNTER — Ambulatory Visit (INDEPENDENT_AMBULATORY_CARE_PROVIDER_SITE_OTHER): Payer: 59 | Admitting: Obstetrics & Gynecology

## 2021-11-25 ENCOUNTER — Encounter: Payer: Self-pay | Admitting: Obstetrics & Gynecology

## 2021-11-25 VITALS — BP 110/70 | HR 87 | Resp 16 | Ht 62.0 in | Wt 166.0 lb

## 2021-11-25 DIAGNOSIS — Z01419 Encounter for gynecological examination (general) (routine) without abnormal findings: Secondary | ICD-10-CM | POA: Diagnosis present

## 2021-11-25 DIAGNOSIS — Z780 Asymptomatic menopausal state: Secondary | ICD-10-CM

## 2021-11-25 DIAGNOSIS — Z9851 Tubal ligation status: Secondary | ICD-10-CM

## 2021-11-25 DIAGNOSIS — M8588 Other specified disorders of bone density and structure, other site: Secondary | ICD-10-CM

## 2021-11-25 NOTE — Progress Notes (Signed)
Karen Peterson [facility] [birth year] [id number]   History:    51 y.o.  G4P4L4  Married.  S/P TL.  2 daughters working at [employer].   RP:  Established patient presenting for annual gyn exam    HPI: Postmenopause, well on no HRT.  No vasomotor menopausal Sx.  No postmenopausal bleeding.  Post Fosamax, Osteopenia at the Spine T-Score -1.7 on BD 12/2020. Taking vitamin D supplements with calcium.  No pain with intercourse.  Pap Neg in 2021.  H/O ASCUS/HPV HR Neg, Colpo Neg in 2020.  Breast normal.  Mammo 07/2021 Neg.  BMI 30.36.  Walking.  Followed by Dr Talmage Nap for DM.  Health labs with Dr Talmage Nap and Melvenia Needles NP.  Will schedule Colono.   Past medical history,surgical history, family history and social history were all reviewed and documented in the EPIC chart.  Gynecologic History Patient's last menstrual period was 10/19/2016.  Obstetric History OB History  Gravida Para Term Preterm AB Living  4 4 4     3   SAB IAB Ectopic Multiple Live Births          4    # Outcome Date GA Lbr Len/2nd Weight Sex Delivery Anes PTL Lv  4 Term     F Vag-Spont  N LIV  3 Term     F Vag-Spont  N LIV  2 Term     M Vag-Spont  N LIV  1 Term     F Vag-Spont  N LIV     ROS: A ROS was performed and pertinent positives and negatives are included in the history.  GENERAL: No fevers or chills. HEENT: No change in vision, no earache, sore throat or sinus congestion. NECK: No pain or stiffness. CARDIOVASCULAR: No chest pain or pressure. No palpitations. PULMONARY: No shortness of breath, cough or wheeze. GASTROINTESTINAL: No abdominal pain, nausea, vomiting or diarrhea, melena or bright red blood per rectum. GENITOURINARY: No urinary frequency, urgency, hesitancy or dysuria. MUSCULOSKELETAL: No joint or muscle pain, no back pain, no recent trauma. DERMATOLOGIC: No rash, no itching, no lesions. ENDOCRINE: No polyuria, polydipsia, no heat or cold intolerance. No recent change in weight. HEMATOLOGICAL: No anemia or easy bruising or  bleeding. NEUROLOGIC: No headache, seizures, numbness, tingling or weakness. PSYCHIATRIC: No depression, no loss of interest in normal activity or change in sleep pattern.     Exam:   BP 110/70    Pulse 87    Resp 16    Ht 5\' 2"  (1.575 m)    Wt 166 lb (75.3 kg)    LMP 10/19/2016    BMI 30.36 kg/m   Body mass index is 30.36 kg/m.  General appearance : Well developed well nourished female. No acute distress HEENT: Eyes: no retinal hemorrhage or exudates,  Neck supple, trachea midline, no carotid bruits, no thyroidmegaly Lungs: Clear to auscultation, no rhonchi or wheezes, or rib retractions  Heart: Regular rate and rhythm, no murmurs or gallops Breast:Examined in sitting and supine position were symmetrical in appearance, no palpable masses or tenderness,  no skin retraction, no nipple inversion, no nipple discharge, no skin discoloration, no axillary or supraclavicular lymphadenopathy Abdomen: no palpable masses or tenderness, no rebound or guarding Extremities: no edema or skin discoloration or tenderness  Pelvic: Vulva: Normal             Vagina: No gross lesions or discharge  Cervix: No gross lesions or discharge.  Pap reflex  Uterus  AV, normal size, shape and consistency, non-tender and  mobile  Adnexa  Without masses or tenderness  Anus: Normal   Assessment/Plan:  51 y.o. female for annual exam   1. Encounter for routine gynecological examination with Papanicolaou smear of cervix Postmenopause, well on no HRT.  No vasomotor menopausal Sx.  No postmenopausal bleeding.  Post Fosamax, Osteopenia at the Spine T-Score -1.7 on BD 12/2020. Taking vitamin D supplements with calcium.  No pain with intercourse.  Pap Neg in 2021.  H/O ASCUS/HPV HR Neg, Colpo Neg in 2020.  Breast normal.  Mammo 07/2021 Neg.  BMI 30.36.  Walking.  Followed by Dr Talmage Nap for DM.  Health labs with Dr Talmage Nap and Melvenia Needles NP.  Will schedule Colono. - Cytology - PAP( )  2. S/P tubal ligation  3.  Postmenopause Postmenopause, well on no HRT.  No vasomotor menopausal Sx.  No postmenopausal bleeding.  4. Osteopenia of lumbar spine Post Fosamax, Osteopenia at the Spine T-Score -1.7 on BD 12/2020. Taking vitamin D supplements with calcium.  Other orders - MOUNJARO 2.5 MG/0.5ML Pen; SMARTSIG:2.5 Milligram(s) SUB-Q Once a Week - metFORMIN (GLUCOPHAGE-XR) 500 MG 24 hr tablet; Take 500 mg by mouth 2 (two) times daily. - CALCIUM PO; Take by mouth.   Genia Del MD, 8:18 AM 11/25/2021

## 2021-11-26 LAB — CYTOLOGY - PAP: Diagnosis: NEGATIVE

## 2022-05-04 DIAGNOSIS — E221 Hyperprolactinemia: Secondary | ICD-10-CM | POA: Diagnosis not present

## 2022-05-04 DIAGNOSIS — E0590 Thyrotoxicosis, unspecified without thyrotoxic crisis or storm: Secondary | ICD-10-CM | POA: Diagnosis not present

## 2022-05-04 DIAGNOSIS — E559 Vitamin D deficiency, unspecified: Secondary | ICD-10-CM | POA: Diagnosis not present

## 2022-05-04 DIAGNOSIS — E1165 Type 2 diabetes mellitus with hyperglycemia: Secondary | ICD-10-CM | POA: Diagnosis not present

## 2022-05-04 DIAGNOSIS — K7689 Other specified diseases of liver: Secondary | ICD-10-CM | POA: Diagnosis not present

## 2022-05-04 DIAGNOSIS — E7800 Pure hypercholesterolemia, unspecified: Secondary | ICD-10-CM | POA: Diagnosis not present

## 2022-05-11 DIAGNOSIS — M810 Age-related osteoporosis without current pathological fracture: Secondary | ICD-10-CM | POA: Diagnosis not present

## 2022-05-11 DIAGNOSIS — E221 Hyperprolactinemia: Secondary | ICD-10-CM | POA: Diagnosis not present

## 2022-05-11 DIAGNOSIS — N951 Menopausal and female climacteric states: Secondary | ICD-10-CM | POA: Diagnosis not present

## 2022-05-11 DIAGNOSIS — E1165 Type 2 diabetes mellitus with hyperglycemia: Secondary | ICD-10-CM | POA: Diagnosis not present

## 2022-07-10 ENCOUNTER — Other Ambulatory Visit: Payer: Self-pay | Admitting: Endocrinology

## 2022-07-10 DIAGNOSIS — Z1231 Encounter for screening mammogram for malignant neoplasm of breast: Secondary | ICD-10-CM

## 2022-08-19 ENCOUNTER — Ambulatory Visit
Admission: RE | Admit: 2022-08-19 | Discharge: 2022-08-19 | Disposition: A | Discharge status: Home / Self Care | Payer: BLUE CROSS/BLUE SHIELD | Source: Ambulatory Visit | Attending: Endocrinology | Admitting: Endocrinology

## 2022-08-19 DIAGNOSIS — Z1231 Encounter for screening mammogram for malignant neoplasm of breast: Secondary | ICD-10-CM

## 2022-10-27 DIAGNOSIS — E1165 Type 2 diabetes mellitus with hyperglycemia: Secondary | ICD-10-CM | POA: Diagnosis not present

## 2022-10-27 DIAGNOSIS — E0590 Thyrotoxicosis, unspecified without thyrotoxic crisis or storm: Secondary | ICD-10-CM | POA: Diagnosis not present

## 2022-10-27 DIAGNOSIS — E7800 Pure hypercholesterolemia, unspecified: Secondary | ICD-10-CM | POA: Diagnosis not present

## 2022-10-27 DIAGNOSIS — E559 Vitamin D deficiency, unspecified: Secondary | ICD-10-CM | POA: Diagnosis not present

## 2022-11-02 DIAGNOSIS — E221 Hyperprolactinemia: Secondary | ICD-10-CM | POA: Diagnosis not present

## 2022-11-02 DIAGNOSIS — N951 Menopausal and female climacteric states: Secondary | ICD-10-CM | POA: Diagnosis not present

## 2022-11-02 DIAGNOSIS — E1165 Type 2 diabetes mellitus with hyperglycemia: Secondary | ICD-10-CM | POA: Diagnosis not present

## 2022-11-02 DIAGNOSIS — M810 Age-related osteoporosis without current pathological fracture: Secondary | ICD-10-CM | POA: Diagnosis not present

## 2022-11-02 DIAGNOSIS — Z23 Encounter for immunization: Secondary | ICD-10-CM | POA: Diagnosis not present

## 2023-02-12 ENCOUNTER — Other Ambulatory Visit (HOSPITAL_COMMUNITY)
Admission: RE | Admit: 2023-02-12 | Discharge: 2023-02-12 | Disposition: A | Discharge status: Home / Self Care | Payer: BC Managed Care – PPO | Source: Ambulatory Visit | Attending: Obstetrics & Gynecology | Admitting: Obstetrics & Gynecology

## 2023-02-12 ENCOUNTER — Encounter: Payer: Self-pay | Admitting: Obstetrics & Gynecology

## 2023-02-12 ENCOUNTER — Ambulatory Visit (INDEPENDENT_AMBULATORY_CARE_PROVIDER_SITE_OTHER): Payer: BC Managed Care – PPO | Admitting: Obstetrics & Gynecology

## 2023-02-12 VITALS — BP 106/76 | HR 85 | Ht 61.75 in | Wt 152.0 lb

## 2023-02-12 DIAGNOSIS — Z9851 Tubal ligation status: Secondary | ICD-10-CM

## 2023-02-12 DIAGNOSIS — Z01419 Encounter for gynecological examination (general) (routine) without abnormal findings: Secondary | ICD-10-CM | POA: Diagnosis not present

## 2023-02-12 DIAGNOSIS — Z780 Asymptomatic menopausal state: Secondary | ICD-10-CM | POA: Diagnosis not present

## 2023-02-12 DIAGNOSIS — M8588 Other specified disorders of bone density and structure, other site: Secondary | ICD-10-CM

## 2023-02-12 NOTE — Progress Notes (Signed)
Anastasia Cuccio [facility] [birth year] [id number]   History:    52 y.o.  G4P4L4  Married.  S/P TL.  2 daughters working at [employer].   RP:  Established patient presenting for annual gyn exam    HPI: Postmenopause, well on no HRT.  No vasomotor menopausal Sx.  No postmenopausal bleeding.  Post Fosamax, Osteopenia at the Spine T-Score -1.7 on BD 12/2020. Repeat BD here now.  Taking vitamin D supplements with calcium.  No pain with intercourse.  Pap Neg 11/2021.  Pap reflex today.  H/O ASCUS/HPV HR Neg, Colpo Neg in 2020.  Breasts normal.  Mammo 07/2022 Neg.  BMI improved to 28.03.  Walking.  Followed by Dr Chalmers Cater for DM.  Health labs with Dr Chalmers Cater and Heriberto Antigua NP. Colono to schedule.  Past medical history,surgical history, family history and social history were all reviewed and documented in the EPIC chart.  Gynecologic History Patient's last menstrual period was 10/19/2016.  Obstetric History OB History  Gravida Para Term Preterm AB Living  4 4 4     3   SAB IAB Ectopic Multiple Live Births          4    # Outcome Date GA Lbr Len/2nd Weight Sex Delivery Anes PTL Lv  4 Term     F Vag-Spont  N LIV  3 Term     F Vag-Spont  N LIV  2 Term     M Vag-Spont  N LIV  1 Term     F Vag-Spont  N LIV     ROS: A ROS was performed and pertinent positives and negatives are included in the history. GENERAL: No fevers or chills. HEENT: No change in vision, no earache, sore throat or sinus congestion. NECK: No pain or stiffness. CARDIOVASCULAR: No chest pain or pressure. No palpitations. PULMONARY: No shortness of breath, cough or wheeze. GASTROINTESTINAL: No abdominal pain, nausea, vomiting or diarrhea, melena or bright red blood per rectum. GENITOURINARY: No urinary frequency, urgency, hesitancy or dysuria. MUSCULOSKELETAL: No joint or muscle pain, no back pain, no recent trauma. DERMATOLOGIC: No rash, no itching, no lesions. ENDOCRINE: No polyuria, polydipsia, no heat or cold intolerance. No recent change in weight.  HEMATOLOGICAL: No anemia or easy bruising or bleeding. NEUROLOGIC: No headache, seizures, numbness, tingling or weakness. PSYCHIATRIC: No depression, no loss of interest in normal activity or change in sleep pattern.     Exam:   BP 106/76   Pulse 85   Ht 5' 1.75" (1.568 m)   Wt 152 lb (68.9 kg)   LMP 10/19/2016 Comment: sexually active, btl  SpO2 98%   BMI 28.03 kg/m   Body mass index is 28.03 kg/m.  General appearance : Well developed well nourished female. No acute distress HEENT: Eyes: no retinal hemorrhage or exudates,  Neck supple, trachea midline, no carotid bruits, no thyroidmegaly Lungs: Clear to auscultation, no rhonchi or wheezes, or rib retractions  Heart: Regular rate and rhythm, no murmurs or gallops Breast:Examined in sitting and supine position were symmetrical in appearance, no palpable masses or tenderness,  no skin retraction, no nipple inversion, no nipple discharge, no skin discoloration, no axillary or supraclavicular lymphadenopathy Abdomen: no palpable masses or tenderness, no rebound or guarding Extremities: no edema or skin discoloration or tenderness  Pelvic: Vulva: Normal             Vagina: No gross lesions or discharge  Cervix: No gross lesions or discharge.  Pap reflex done.  Uterus  AV, normal size, shape  and consistency, non-tender and mobile  Adnexa  Without masses or tenderness  Anus: Normal   Assessment/Plan:  52 y.o. female for annual exam   1. Encounter for routine gynecological examination with Papanicolaou smear of cervix Postmenopause, well on no HRT.  No vasomotor menopausal Sx.  No postmenopausal bleeding.  Post Fosamax, Osteopenia at the Spine T-Score -1.7 on BD 12/2020. Repeat BD here now.  Taking vitamin D supplements with calcium.  No pain with intercourse.  Pap Neg 11/2021.  Pap reflex today.  H/O ASCUS/HPV HR Neg, Colpo Neg in 2020.  Breasts normal.  Mammo 07/2022 Neg.  BMI improved to 28.03.  Walking.  Followed by Dr Chalmers Cater for DM.   Health labs with Dr Chalmers Cater and Heriberto Antigua NP. Colono to schedule. - Cytology - PAP( [place])  2. S/P tubal ligation  3. Postmenopause Postmenopause, well on no HRT.  No vasomotor menopausal Sx.  No postmenopausal bleeding.  4. Osteopenia of lumbar spine Post Fosamax, Osteopenia at the Spine T-Score -1.7 on BD 12/2020. Repeat BD here now.  Taking vitamin D supplements with calcium. - DG Bone Density; Future  Other orders - Vitamin D, Ergocalciferol, (DRISDOL) 1.25 MG (50000 UNIT) CAPS capsule; Take 50,000 Units by mouth once a week. - MOUNJARO 5 MG/0.5ML Pen; SMARTSIG:5 Milligram(s) SUB-Q Once a Week   Princess Bruins MD, 11:47 AM

## 2023-02-16 LAB — CYTOLOGY - PAP: Diagnosis: NEGATIVE

## 2023-03-17 NOTE — Progress Notes (Signed)
Karen Peterson is a 52 y.o. female here for a new patient visit.  History of Present Illness:   Chief Complaint  Patient presents with   Establish Care    HPI  Type 2 Diabetes Mellitus Treated with metformin 500 mg twice daily, Mounjaro 5 mg once weekly. Followed by Dr. Talmage Nap, endocrinologist. Last A1c was 6% when she saw Dr Talmage Nap in December 2023.  Hyperlipidemia Treated with simvastatin 80 mg daily. Tolerates well, denies concerns  Anxiety Treated with alprazolam 0.25 mg at bedtime as needed. Prescribed by Dr. Talmage Nap due to situational stress. Has some insomnia but does not take any meds for this.  Osteopenia 02/23/18 DEXA scan showed T-score of -1.8 at [facility].  Vitamin D Deficiency Treated with Drisdol 50,000 units weekly.  Elevated blood pressure reading Currently taking no medication. At home blood pressure readings are: not checked. Patient denies chest pain, SOB, blurred vision, dizziness, unusual headaches, lower leg swelling.  Denies excessive caffeine intake, stimulant usage, excessive alcohol intake, or increase in salt consumption.  BP Readings from Last 3 Encounters:  03/24/23 (!) 160/90  02/12/23 106/76  11/25/21 110/70     Pending labs. Agreeable to GI consult for colonoscopy. UTD on mammogram, pap smear, diabetic foot exam. UTD on tetanus, flu vaccines. Discussed shingles vaccine. UTD on eye exam.  Past Medical History:  Diagnosis Date   Allergy    Decreased bone mass    Diabetes mellitus    TYPE II   High cholesterol    Hyperprolactinemia (HCC)    Insomnia    Osteoporosis 02/2018   2016 T score -2.5 2019 T score -1.8 on Fosamax   Premature ovarian failure    Vaginal delivery    1989, 1992, 1995, 2004   Vitamin D deficiency      Social History   Tobacco Use   Smoking status: Never   Smokeless tobacco: Never  Vaping Use   Vaping Use: Never used  Substance Use Topics   Alcohol use: Yes    Comment: socially   Drug use: Never     Past Surgical History:  Procedure Laterality Date   CHOLECYSTECTOMY     TUBAL LIGATION     PPTL    Family History  Problem Relation Age of Onset   Early death Mother    Alcohol abuse Father    Hypertension Father    Diabetes Father    Heart disease Father    Cancer Father        stomach with mets   Diabetes Sister    Breast cancer Neg Hx     No Known Allergies  Current Medications:   Current Outpatient Medications:    ALPRAZolam (XANAX) 0.25 MG tablet, Take 0.25 mg by mouth at bedtime as needed for anxiety., Disp: , Rfl:    CALCIUM PO, Take 150 mg by mouth every other day., Disp: , Rfl:    Ferrous Sulfate (IRON) 325 (65 Fe) MG TABS, Take 1 tablet by mouth every other day., Disp: , Rfl:    metFORMIN (GLUCOPHAGE-XR) 500 MG 24 hr tablet, Take 500 mg by mouth daily with breakfast., Disp: , Rfl:    MOUNJARO 5 MG/0.5ML Pen, SMARTSIG:5 Milligram(s) SUB-Q Once a Week, Disp: , Rfl:    Multiple Vitamin (MULTIVITAMIN) tablet, Take 1 tablet by mouth daily., Disp: , Rfl:    simvastatin (ZOCOR) 80 MG tablet, Take 80 mg by mouth daily., Disp: , Rfl:    Vitamin D, Ergocalciferol, (DRISDOL) 1.25 MG (50000 UNIT) CAPS  capsule, Take 50,000 Units by mouth once a week., Disp: , Rfl:    Review of Systems:   Review of Systems  Constitutional:  Negative for fever and malaise/fatigue.  HENT:  Negative for congestion.   Eyes:  Negative for blurred vision.  Respiratory:  Negative for cough and shortness of breath.   Cardiovascular:  Negative for chest pain, palpitations and leg swelling.  Gastrointestinal:  Negative for vomiting.  Musculoskeletal:  Negative for back pain.  Skin:  Negative for rash.  Neurological:  Negative for loss of consciousness and headaches.    Vitals:   Vitals:   03/24/23 1359 03/24/23 1422  BP: (!) 146/90 (!) 160/90  Pulse: 91   Temp: 98 F (36.7 C)   TempSrc: Temporal   SpO2: 97%   Weight: 152 lb (68.9 kg)   Height: 5\' 2"  (1.575 m)      Body mass index  is 27.8 kg/m.  Physical Exam:   Physical Exam Vitals and nursing note reviewed.  Constitutional:      General: She is not in acute distress.    Appearance: She is well-developed. She is not ill-appearing or toxic-appearing.  Cardiovascular:     Rate and Rhythm: Normal rate and regular rhythm.     Pulses: Normal pulses.     Heart sounds: Normal heart sounds, S1 normal and S2 normal.  Pulmonary:     Effort: Pulmonary effort is normal.     Breath sounds: Normal breath sounds.  Skin:    General: Skin is warm and dry.  Neurological:     Mental Status: She is alert.     GCS: GCS eye subscore is 4. GCS verbal subscore is 5. GCS motor subscore is 6.  Psychiatric:        Speech: Speech normal.        Behavior: Behavior normal. Behavior is cooperative.     Assessment and Plan:   Type 2 diabetes mellitus without complication, without long-term current use of insulin (HCC) Well controlled Management per endo  Hyperlipidemia, unspecified hyperlipidemia type Well controlled Management per endo  Special screening for malignant neoplasms, colon Referral to GI  Anxiety Well controlled Management per endo  Elevated blood pressure reading Above goal No evidence of end organ damage Recommend close monitoring at home If consistently >140/90, recommend let us know  I,Alexander Ruley,acting as a scribe for [employer], PA.,have documented all relevant documentation on the behalf of Jarold Motto, PA,as directed by  Jarold Motto, PA while in the presence of Jarold Motto, [place].   I, Jarold Motto, [place], have reviewed all documentation for this visit. The documentation on 03/24/23 for the exam, diagnosis, procedures, and orders are all accurate and complete.    Jarold Motto, PA-C

## 2023-03-23 ENCOUNTER — Ambulatory Visit (INDEPENDENT_AMBULATORY_CARE_PROVIDER_SITE_OTHER): Payer: BC Managed Care – PPO

## 2023-03-23 ENCOUNTER — Other Ambulatory Visit: Payer: Self-pay | Admitting: Obstetrics & Gynecology

## 2023-03-23 DIAGNOSIS — Z13820 Encounter for screening for osteoporosis: Secondary | ICD-10-CM | POA: Diagnosis not present

## 2023-03-23 DIAGNOSIS — Z780 Asymptomatic menopausal state: Secondary | ICD-10-CM

## 2023-03-23 DIAGNOSIS — M8588 Other specified disorders of bone density and structure, other site: Secondary | ICD-10-CM

## 2023-03-23 DIAGNOSIS — Z9851 Tubal ligation status: Secondary | ICD-10-CM

## 2023-03-23 DIAGNOSIS — Z01419 Encounter for gynecological examination (general) (routine) without abnormal findings: Secondary | ICD-10-CM

## 2023-03-24 ENCOUNTER — Ambulatory Visit: Payer: BC Managed Care – PPO | Admitting: Physician Assistant

## 2023-03-24 ENCOUNTER — Encounter: Payer: Self-pay | Admitting: Physician Assistant

## 2023-03-24 VITALS — BP 160/90 | HR 91 | Temp 98.0°F | Ht 62.0 in | Wt 152.0 lb

## 2023-03-24 DIAGNOSIS — E785 Hyperlipidemia, unspecified: Secondary | ICD-10-CM | POA: Diagnosis not present

## 2023-03-24 DIAGNOSIS — F419 Anxiety disorder, unspecified: Secondary | ICD-10-CM | POA: Diagnosis not present

## 2023-03-24 DIAGNOSIS — E119 Type 2 diabetes mellitus without complications: Secondary | ICD-10-CM

## 2023-03-24 DIAGNOSIS — R030 Elevated blood-pressure reading, without diagnosis of hypertension: Secondary | ICD-10-CM

## 2023-03-24 DIAGNOSIS — Z1211 Encounter for screening for malignant neoplasm of colon: Secondary | ICD-10-CM

## 2023-03-24 NOTE — Patient Instructions (Signed)
It was great to see you!  Please let us know where you would like referral to gastroenterology and we will send you there for colonoscopy  Keep up the great work with all of your health!  Let's follow-up as needed, sooner if you have concerns.  Take care,  Jarold Motto PA-C

## 2023-04-07 ENCOUNTER — Encounter: Payer: Self-pay | Admitting: Internal Medicine

## 2023-04-21 DIAGNOSIS — R1010 Upper abdominal pain, unspecified: Secondary | ICD-10-CM | POA: Diagnosis not present

## 2023-04-21 DIAGNOSIS — K6389 Other specified diseases of intestine: Secondary | ICD-10-CM | POA: Diagnosis not present

## 2023-04-21 DIAGNOSIS — K529 Noninfective gastroenteritis and colitis, unspecified: Secondary | ICD-10-CM | POA: Diagnosis not present

## 2023-04-21 DIAGNOSIS — R110 Nausea: Secondary | ICD-10-CM | POA: Diagnosis not present

## 2023-04-21 DIAGNOSIS — R140 Abdominal distension (gaseous): Secondary | ICD-10-CM | POA: Diagnosis not present

## 2023-04-21 DIAGNOSIS — D72829 Elevated white blood cell count, unspecified: Secondary | ICD-10-CM | POA: Diagnosis not present

## 2023-04-21 DIAGNOSIS — K3189 Other diseases of stomach and duodenum: Secondary | ICD-10-CM | POA: Diagnosis not present

## 2023-04-21 DIAGNOSIS — K838 Other specified diseases of biliary tract: Secondary | ICD-10-CM | POA: Diagnosis not present

## 2023-04-21 DIAGNOSIS — R000 Tachycardia, unspecified: Secondary | ICD-10-CM | POA: Diagnosis not present

## 2023-04-30 DIAGNOSIS — R1013 Epigastric pain: Secondary | ICD-10-CM | POA: Diagnosis not present

## 2023-04-30 DIAGNOSIS — R112 Nausea with vomiting, unspecified: Secondary | ICD-10-CM | POA: Diagnosis not present

## 2023-04-30 DIAGNOSIS — R6881 Early satiety: Secondary | ICD-10-CM | POA: Diagnosis not present

## 2023-04-30 DIAGNOSIS — R197 Diarrhea, unspecified: Secondary | ICD-10-CM | POA: Diagnosis not present

## 2023-05-04 DIAGNOSIS — R197 Diarrhea, unspecified: Secondary | ICD-10-CM | POA: Diagnosis not present

## 2023-05-04 DIAGNOSIS — K3189 Other diseases of stomach and duodenum: Secondary | ICD-10-CM | POA: Diagnosis not present

## 2023-05-04 DIAGNOSIS — R1013 Epigastric pain: Secondary | ICD-10-CM | POA: Diagnosis not present

## 2023-05-04 DIAGNOSIS — K2950 Unspecified chronic gastritis without bleeding: Secondary | ICD-10-CM | POA: Diagnosis not present

## 2023-05-04 LAB — HM COLONOSCOPY

## 2023-05-05 DIAGNOSIS — E0590 Thyrotoxicosis, unspecified without thyrotoxic crisis or storm: Secondary | ICD-10-CM | POA: Diagnosis not present

## 2023-05-05 DIAGNOSIS — E559 Vitamin D deficiency, unspecified: Secondary | ICD-10-CM | POA: Diagnosis not present

## 2023-05-05 DIAGNOSIS — E7800 Pure hypercholesterolemia, unspecified: Secondary | ICD-10-CM | POA: Diagnosis not present

## 2023-05-05 DIAGNOSIS — E1165 Type 2 diabetes mellitus with hyperglycemia: Secondary | ICD-10-CM | POA: Diagnosis not present

## 2023-05-11 DIAGNOSIS — M810 Age-related osteoporosis without current pathological fracture: Secondary | ICD-10-CM | POA: Diagnosis not present

## 2023-05-11 DIAGNOSIS — E1165 Type 2 diabetes mellitus with hyperglycemia: Secondary | ICD-10-CM | POA: Diagnosis not present

## 2023-05-11 DIAGNOSIS — E221 Hyperprolactinemia: Secondary | ICD-10-CM | POA: Diagnosis not present

## 2023-05-11 DIAGNOSIS — E0590 Thyrotoxicosis, unspecified without thyrotoxic crisis or storm: Secondary | ICD-10-CM | POA: Diagnosis not present

## 2023-05-11 DIAGNOSIS — K2980 Duodenitis without bleeding: Secondary | ICD-10-CM | POA: Diagnosis not present

## 2023-06-04 ENCOUNTER — Encounter: Payer: BC Managed Care – PPO | Admitting: Internal Medicine

## 2023-07-13 ENCOUNTER — Other Ambulatory Visit: Payer: Self-pay | Admitting: Endocrinology

## 2023-07-13 DIAGNOSIS — Z1231 Encounter for screening mammogram for malignant neoplasm of breast: Secondary | ICD-10-CM

## 2023-07-19 DIAGNOSIS — K219 Gastro-esophageal reflux disease without esophagitis: Secondary | ICD-10-CM | POA: Diagnosis not present

## 2023-07-19 DIAGNOSIS — R197 Diarrhea, unspecified: Secondary | ICD-10-CM | POA: Diagnosis not present

## 2023-08-13 DIAGNOSIS — E559 Vitamin D deficiency, unspecified: Secondary | ICD-10-CM | POA: Diagnosis not present

## 2023-08-13 DIAGNOSIS — E1165 Type 2 diabetes mellitus with hyperglycemia: Secondary | ICD-10-CM | POA: Diagnosis not present

## 2023-08-13 DIAGNOSIS — E0590 Thyrotoxicosis, unspecified without thyrotoxic crisis or storm: Secondary | ICD-10-CM | POA: Diagnosis not present

## 2023-08-13 DIAGNOSIS — E7800 Pure hypercholesterolemia, unspecified: Secondary | ICD-10-CM | POA: Diagnosis not present

## 2023-08-27 ENCOUNTER — Ambulatory Visit
Admission: RE | Admit: 2023-08-27 | Discharge: 2023-08-27 | Disposition: A | Discharge status: Home / Self Care | Payer: BC Managed Care – PPO | Source: Ambulatory Visit | Attending: Endocrinology | Admitting: Endocrinology

## 2023-08-27 DIAGNOSIS — Z1231 Encounter for screening mammogram for malignant neoplasm of breast: Secondary | ICD-10-CM | POA: Diagnosis not present

## 2023-10-20 DIAGNOSIS — E7800 Pure hypercholesterolemia, unspecified: Secondary | ICD-10-CM | POA: Diagnosis not present

## 2023-10-20 DIAGNOSIS — E1165 Type 2 diabetes mellitus with hyperglycemia: Secondary | ICD-10-CM | POA: Diagnosis not present

## 2024-01-28 DIAGNOSIS — K219 Gastro-esophageal reflux disease without esophagitis: Secondary | ICD-10-CM | POA: Diagnosis not present

## 2024-01-28 DIAGNOSIS — R197 Diarrhea, unspecified: Secondary | ICD-10-CM | POA: Diagnosis not present

## 2024-04-21 DIAGNOSIS — E1165 Type 2 diabetes mellitus with hyperglycemia: Secondary | ICD-10-CM | POA: Diagnosis not present

## 2024-04-28 DIAGNOSIS — E7800 Pure hypercholesterolemia, unspecified: Secondary | ICD-10-CM | POA: Diagnosis not present

## 2024-04-28 DIAGNOSIS — E559 Vitamin D deficiency, unspecified: Secondary | ICD-10-CM | POA: Diagnosis not present

## 2024-04-28 DIAGNOSIS — E1165 Type 2 diabetes mellitus with hyperglycemia: Secondary | ICD-10-CM | POA: Diagnosis not present

## 2024-04-28 DIAGNOSIS — F419 Anxiety disorder, unspecified: Secondary | ICD-10-CM | POA: Diagnosis not present

## 2024-04-28 DIAGNOSIS — K7689 Other specified diseases of liver: Secondary | ICD-10-CM | POA: Diagnosis not present

## 2024-04-28 DIAGNOSIS — E221 Hyperprolactinemia: Secondary | ICD-10-CM | POA: Diagnosis not present

## 2024-04-28 DIAGNOSIS — E0590 Thyrotoxicosis, unspecified without thyrotoxic crisis or storm: Secondary | ICD-10-CM | POA: Diagnosis not present

## 2024-04-28 DIAGNOSIS — N951 Menopausal and female climacteric states: Secondary | ICD-10-CM | POA: Diagnosis not present

## 2024-04-28 DIAGNOSIS — M810 Age-related osteoporosis without current pathological fracture: Secondary | ICD-10-CM | POA: Diagnosis not present

## 2024-06-16 DIAGNOSIS — M79604 Pain in right leg: Secondary | ICD-10-CM | POA: Diagnosis not present

## 2024-06-16 DIAGNOSIS — M79605 Pain in left leg: Secondary | ICD-10-CM | POA: Diagnosis not present

## 2024-06-16 DIAGNOSIS — I872 Venous insufficiency (chronic) (peripheral): Secondary | ICD-10-CM | POA: Diagnosis not present

## 2024-06-16 DIAGNOSIS — I83893 Varicose veins of bilateral lower extremities with other complications: Secondary | ICD-10-CM | POA: Diagnosis not present

## 2024-06-16 DIAGNOSIS — M7989 Other specified soft tissue disorders: Secondary | ICD-10-CM | POA: Diagnosis not present

## 2024-07-26 ENCOUNTER — Other Ambulatory Visit: Payer: Self-pay | Admitting: Physician Assistant

## 2024-07-26 DIAGNOSIS — Z1231 Encounter for screening mammogram for malignant neoplasm of breast: Secondary | ICD-10-CM

## 2024-08-28 ENCOUNTER — Ambulatory Visit
Admission: RE | Admit: 2024-08-28 | Discharge: 2024-08-28 | Disposition: A | Discharge status: Home / Self Care | Source: Ambulatory Visit | Attending: Physician Assistant | Admitting: Physician Assistant

## 2024-08-28 DIAGNOSIS — Z1231 Encounter for screening mammogram for malignant neoplasm of breast: Secondary | ICD-10-CM

## 2024-09-18 DIAGNOSIS — R252 Cramp and spasm: Secondary | ICD-10-CM | POA: Diagnosis not present

## 2024-09-18 DIAGNOSIS — R600 Localized edema: Secondary | ICD-10-CM | POA: Diagnosis not present

## 2024-09-18 DIAGNOSIS — I83892 Varicose veins of left lower extremities with other complications: Secondary | ICD-10-CM | POA: Diagnosis not present

## 2024-09-18 DIAGNOSIS — I83893 Varicose veins of bilateral lower extremities with other complications: Secondary | ICD-10-CM | POA: Diagnosis not present

## 2024-10-03 DIAGNOSIS — I872 Venous insufficiency (chronic) (peripheral): Secondary | ICD-10-CM | POA: Diagnosis not present

## 2024-10-06 DIAGNOSIS — E119 Type 2 diabetes mellitus without complications: Secondary | ICD-10-CM | POA: Diagnosis not present

## 2024-10-06 DIAGNOSIS — I87392 Chronic venous hypertension (idiopathic) with other complications of left lower extremity: Secondary | ICD-10-CM | POA: Diagnosis not present

## 2024-10-06 DIAGNOSIS — Z09 Encounter for follow-up examination after completed treatment for conditions other than malignant neoplasm: Secondary | ICD-10-CM | POA: Diagnosis not present

## 2024-10-06 DIAGNOSIS — I872 Venous insufficiency (chronic) (peripheral): Secondary | ICD-10-CM | POA: Diagnosis not present

## 2024-10-13 DIAGNOSIS — M79605 Pain in left leg: Secondary | ICD-10-CM | POA: Diagnosis not present

## 2024-10-13 DIAGNOSIS — I872 Venous insufficiency (chronic) (peripheral): Secondary | ICD-10-CM | POA: Diagnosis not present

## 2024-10-13 DIAGNOSIS — I87392 Chronic venous hypertension (idiopathic) with other complications of left lower extremity: Secondary | ICD-10-CM | POA: Diagnosis not present

## 2024-10-25 DIAGNOSIS — D72829 Elevated white blood cell count, unspecified: Secondary | ICD-10-CM | POA: Diagnosis not present

## 2024-10-25 DIAGNOSIS — E119 Type 2 diabetes mellitus without complications: Secondary | ICD-10-CM | POA: Diagnosis not present

## 2024-10-25 DIAGNOSIS — E7800 Pure hypercholesterolemia, unspecified: Secondary | ICD-10-CM | POA: Diagnosis not present

## 2024-10-25 DIAGNOSIS — R1013 Epigastric pain: Secondary | ICD-10-CM | POA: Diagnosis not present

## 2024-10-25 DIAGNOSIS — K219 Gastro-esophageal reflux disease without esophagitis: Secondary | ICD-10-CM | POA: Diagnosis not present

## 2024-10-25 DIAGNOSIS — Z79899 Other long term (current) drug therapy: Secondary | ICD-10-CM | POA: Diagnosis not present

## 2024-10-25 DIAGNOSIS — R079 Chest pain, unspecified: Secondary | ICD-10-CM | POA: Diagnosis not present

## 2024-10-25 DIAGNOSIS — M549 Dorsalgia, unspecified: Secondary | ICD-10-CM | POA: Diagnosis not present

## 2024-10-25 DIAGNOSIS — Z9049 Acquired absence of other specified parts of digestive tract: Secondary | ICD-10-CM | POA: Diagnosis not present

## 2024-10-25 DIAGNOSIS — R0789 Other chest pain: Secondary | ICD-10-CM | POA: Diagnosis not present

## 2024-10-26 ENCOUNTER — Telehealth: Payer: Self-pay

## 2024-10-26 NOTE — Telephone Encounter (Signed)
 Transition Care Management Unsuccessful Follow-up Telephone Call  Date of discharge and from where:  10/25/24 [facility]  Attempts:  1st Attempt  Reason for unsuccessful TCM follow-up call:  Left voice message; LVM for patient to complete TOC call and schedule for ED follow up with PCP. Advised to call our office to schedule this appointment. If pt returns call please schedule pt accordingly.

## 2024-10-27 DIAGNOSIS — E1165 Type 2 diabetes mellitus with hyperglycemia: Secondary | ICD-10-CM | POA: Diagnosis not present

## 2024-10-27 DIAGNOSIS — E0590 Thyrotoxicosis, unspecified without thyrotoxic crisis or storm: Secondary | ICD-10-CM | POA: Diagnosis not present

## 2024-10-27 DIAGNOSIS — E559 Vitamin D deficiency, unspecified: Secondary | ICD-10-CM | POA: Diagnosis not present

## 2024-10-30 ENCOUNTER — Telehealth: Payer: Self-pay

## 2024-10-30 NOTE — Telephone Encounter (Signed)
 Transition Care Management Unsuccessful Follow-up Telephone Call  Date of discharge and from where:  10/25/24 [facility]  Attempts:  2nd Attempt  Reason for unsuccessful TCM follow-up call:  Left voice message

## 2024-11-03 DIAGNOSIS — E0590 Thyrotoxicosis, unspecified without thyrotoxic crisis or storm: Secondary | ICD-10-CM | POA: Diagnosis not present

## 2024-11-03 DIAGNOSIS — E1165 Type 2 diabetes mellitus with hyperglycemia: Secondary | ICD-10-CM | POA: Diagnosis not present

## 2024-11-03 DIAGNOSIS — E7800 Pure hypercholesterolemia, unspecified: Secondary | ICD-10-CM | POA: Diagnosis not present

## 2024-11-03 DIAGNOSIS — E221 Hyperprolactinemia: Secondary | ICD-10-CM | POA: Diagnosis not present

## 2024-11-03 DIAGNOSIS — E559 Vitamin D deficiency, unspecified: Secondary | ICD-10-CM | POA: Diagnosis not present

## 2024-11-03 DIAGNOSIS — M810 Age-related osteoporosis without current pathological fracture: Secondary | ICD-10-CM | POA: Diagnosis not present

## 2024-11-03 DIAGNOSIS — N951 Menopausal and female climacteric states: Secondary | ICD-10-CM | POA: Diagnosis not present

## 2024-11-10 ENCOUNTER — Other Ambulatory Visit (HOSPITAL_COMMUNITY): Payer: Self-pay

## 2024-11-10 ENCOUNTER — Other Ambulatory Visit: Payer: Self-pay

## 2024-11-10 MED ORDER — METOPROLOL SUCCINATE ER 25 MG PO TB24
25.0000 mg | ORAL_TABLET | Freq: Every day | ORAL | 5 refills | Status: DC
  Filled 2024-11-10 (×2): qty 30, 30d supply, fill #0

## 2024-11-10 MED ORDER — NITROGLYCERIN 0.3 MG SL SUBL
0.3000 mg | SUBLINGUAL_TABLET | SUBLINGUAL | 3 refills | Status: AC | PRN
  Filled 2024-11-10: qty 100, 25d supply, fill #0

## 2024-11-10 MED ORDER — ISOSORBIDE MONONITRATE ER 30 MG PO TB24
30.0000 mg | ORAL_TABLET | Freq: Every day | ORAL | 5 refills | Status: DC
  Filled 2024-11-10: qty 30, 30d supply, fill #0

## 2024-11-10 MED ORDER — METOPROLOL SUCCINATE ER 25 MG PO TB24
25.0000 mg | ORAL_TABLET | Freq: Every day | ORAL | 5 refills | Status: AC
  Filled 2024-11-10: qty 30, 30d supply, fill #0

## 2024-11-10 MED ORDER — ASPIRIN 81 MG PO TBEC
81.0000 mg | DELAYED_RELEASE_TABLET | Freq: Every day | ORAL | 11 refills | Status: AC
  Filled 2024-11-10: qty 30, 30d supply, fill #0

## 2024-11-12 ENCOUNTER — Other Ambulatory Visit (HOSPITAL_COMMUNITY): Payer: Self-pay

## 2024-11-12 MED ORDER — METOPROLOL SUCCINATE ER 25 MG PO TB24
12.5000 mg | ORAL_TABLET | Freq: Every day | ORAL | 5 refills | Status: AC
  Filled 2024-11-12: qty 15, 30d supply, fill #0

## 2024-11-13 ENCOUNTER — Other Ambulatory Visit (HOSPITAL_COMMUNITY): Payer: Self-pay

## 2024-11-13 MED ORDER — NITROGLYCERIN 0.3 MG SL SUBL
0.3000 mg | SUBLINGUAL_TABLET | SUBLINGUAL | 3 refills | Status: AC | PRN
  Filled 2024-11-13: qty 100, 90d supply, fill #0

## 2024-11-13 MED FILL — Aspirin Tab Delayed Release 81 MG: 81.0000 mg | ORAL | 30 days supply | Qty: 30 | Fill #0 | Status: CN

## 2024-11-14 ENCOUNTER — Other Ambulatory Visit (HOSPITAL_COMMUNITY): Payer: Self-pay

## 2024-12-05 ENCOUNTER — Other Ambulatory Visit: Payer: Self-pay

## 2025-01-26 ENCOUNTER — Encounter: Admitting: Physician Assistant
